# Patient Record
Sex: Female | Born: 1985 | Race: Black or African American | Hispanic: No | Marital: Single | Smoking: Former smoker
Health system: Southern US, Community
[De-identification: ages and names within clinical notes are randomized; demographics above are authoritative.]

## PROBLEM LIST (undated history)

## (undated) DIAGNOSIS — N926 Irregular menstruation, unspecified: Secondary | ICD-10-CM

## (undated) DIAGNOSIS — IMO0002 Reserved for concepts with insufficient information to code with codable children: Secondary | ICD-10-CM

## (undated) HISTORY — DX: Reserved for concepts with insufficient information to code with codable children: IMO0002

## (undated) HISTORY — DX: Irregular menstruation, unspecified: N92.6

---

## 2006-08-21 ENCOUNTER — Observation Stay: Payer: Self-pay

## 2006-09-06 ENCOUNTER — Inpatient Hospital Stay: Payer: Self-pay | Admitting: Obstetrics and Gynecology

## 2006-09-14 ENCOUNTER — Emergency Department: Payer: Self-pay | Admitting: Internal Medicine

## 2008-09-04 HISTORY — PX: CHOLECYSTECTOMY, LAPAROSCOPIC: SHX56

## 2008-12-21 ENCOUNTER — Emergency Department: Payer: Self-pay | Admitting: Emergency Medicine

## 2009-01-19 ENCOUNTER — Emergency Department: Payer: Self-pay | Admitting: Emergency Medicine

## 2010-05-16 IMAGING — US ABDOMEN ULTRASOUND
1 series · 17 of 25 positions shown · non-contrast
Comparison: none

REASON FOR EXAM: RUQ pain
COMMENTS:

PROCEDURE:     US  - US ABDOMEN GENERAL SURVEY  - January 19, 2009  [DATE]
RESULT:     Comparison: None
TECHNIQUE: Multiple gray-scale and color-flow Doppler images of the abdomen
are presented for review.

[Series 1: abdomen ultrasound · 17 of 46 slices shown]
[im 1/46]
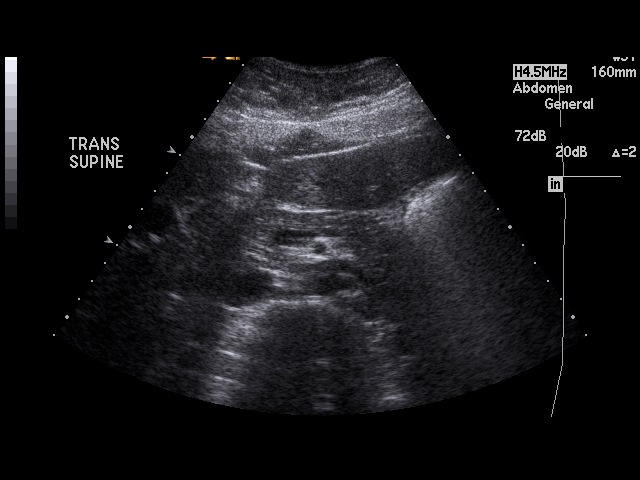
[im 4/46]
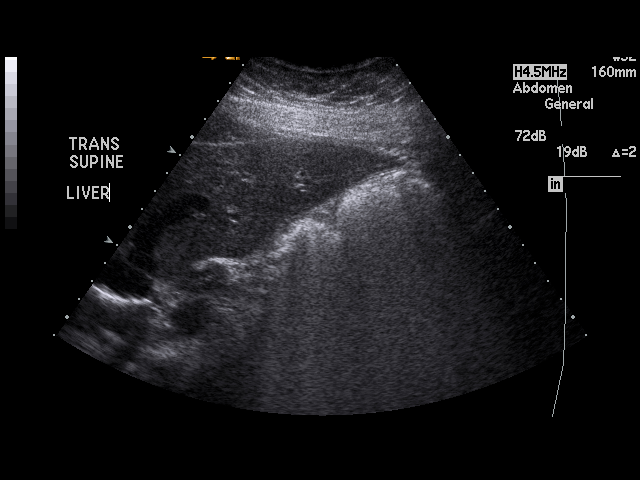
[im 6/46]
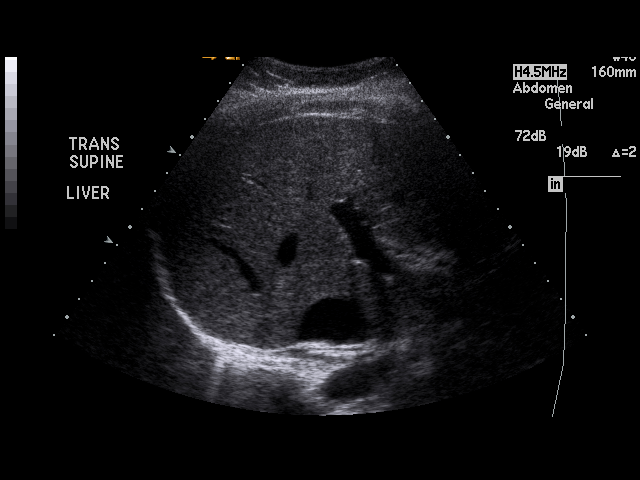
[im 10/46]
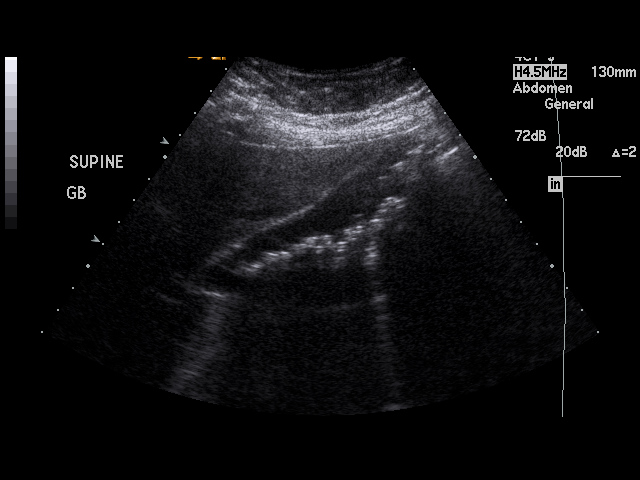
[im 12/46]
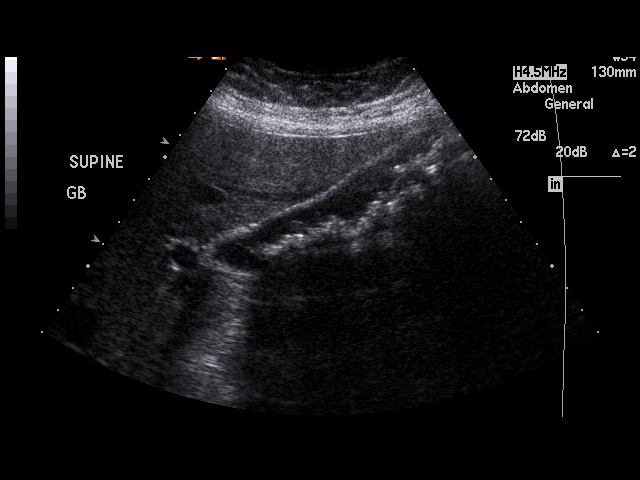
[im 16/46]
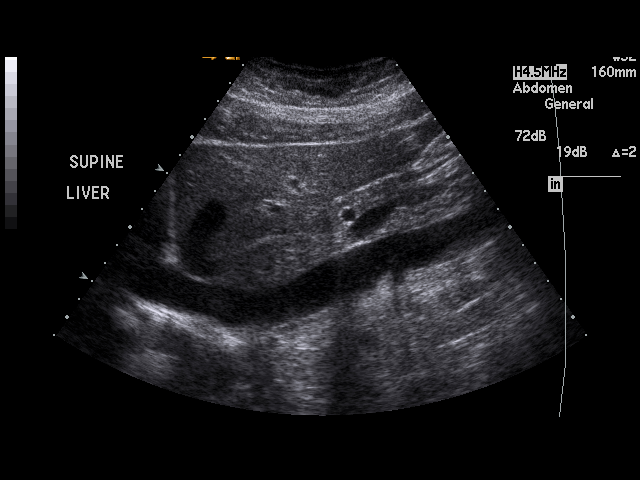
[im 17/46]
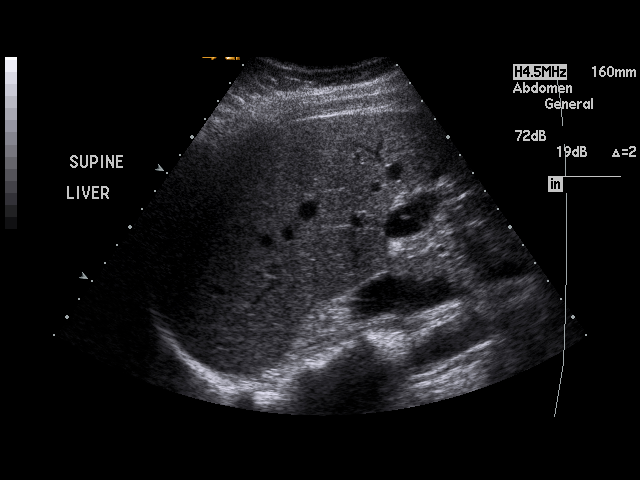
[im 21/46]
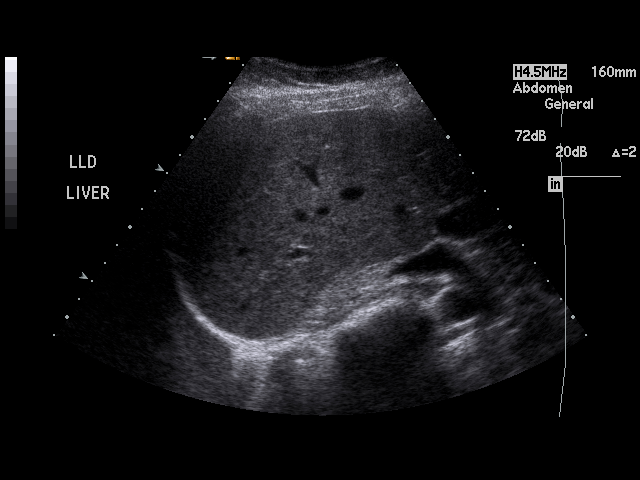
[im 23/46]
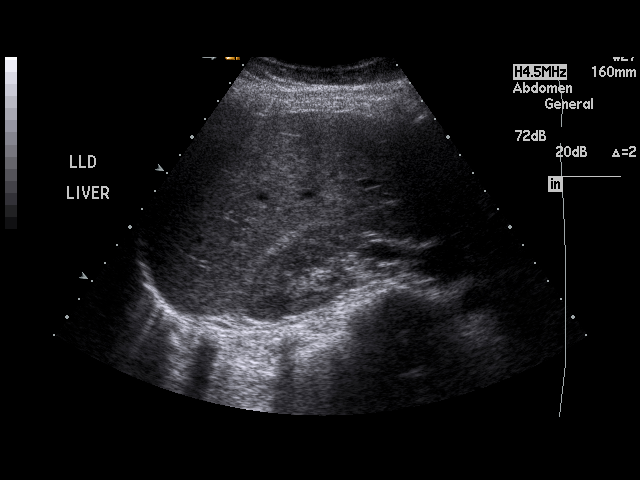
[im 25/46]
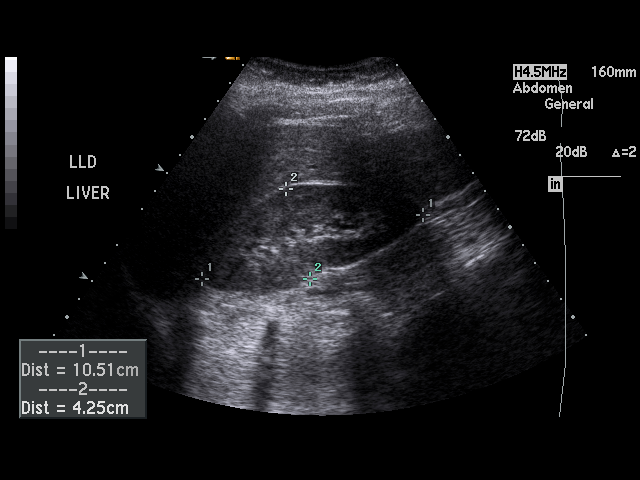
[im 29/46]
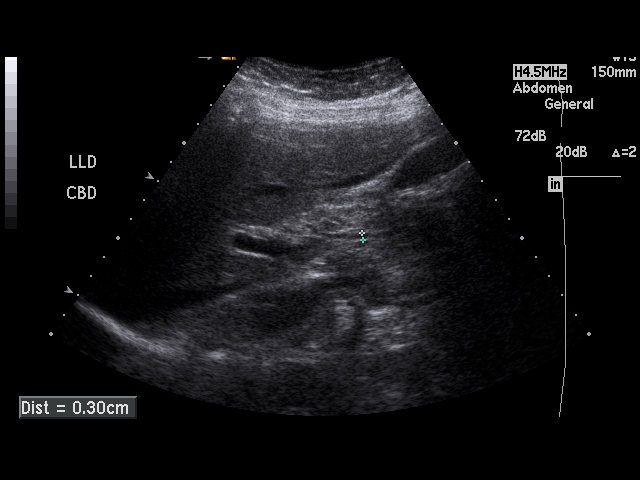
[im 31/46]
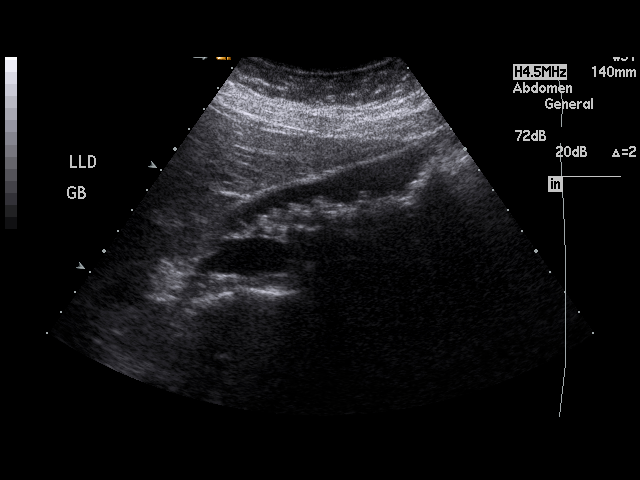
[im 34/46]
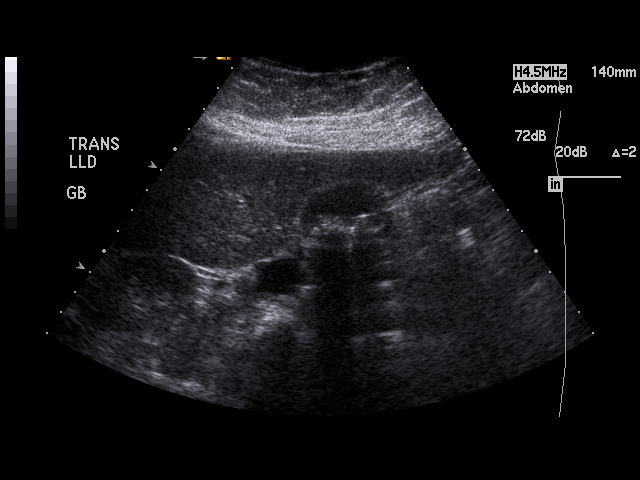
[im 36/46]
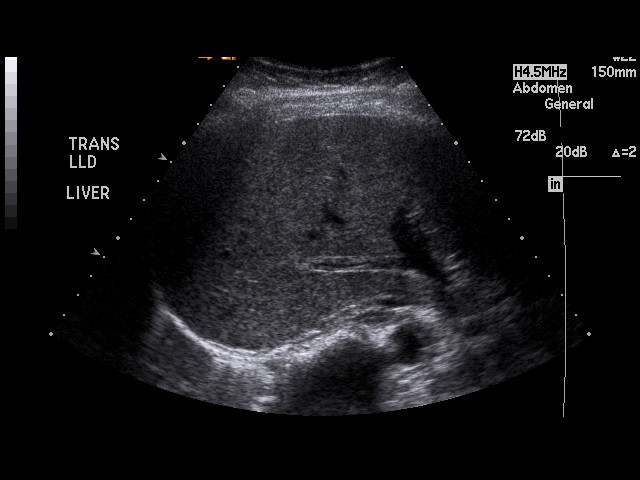
[im 40/46]
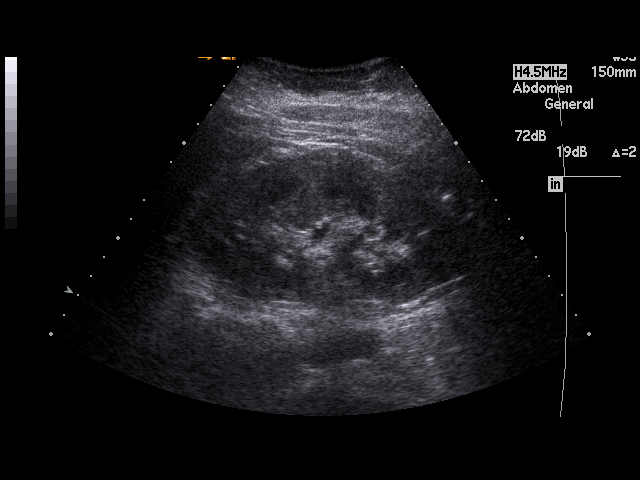
[im 42/46]
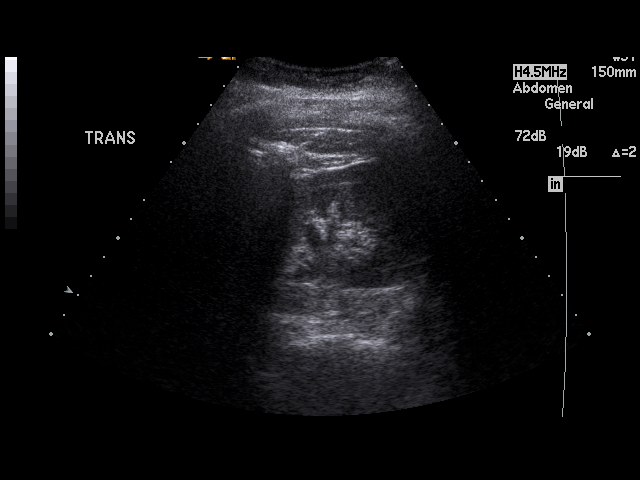
[im 46/46]
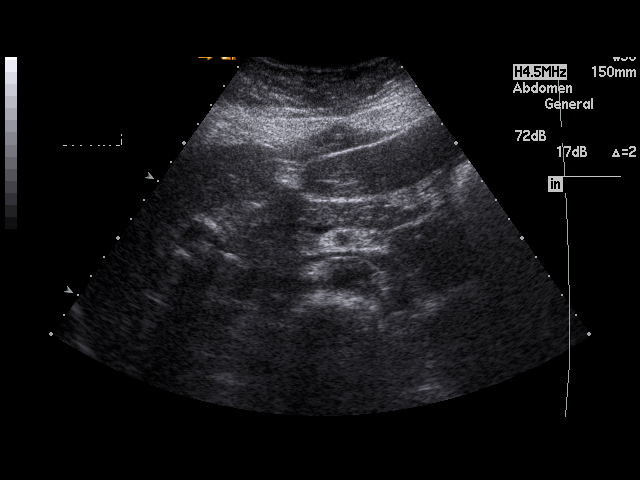

[17 of 25 positions shown; findings below may reference images not displayed]

FINDINGS: Visualized portions of the liver demonstrate normal echogenicity and normal
contours. The liver is without evidence of  focal hepatic lesion.

There are multiple mobile cholelithiasis. There is no intra or extrahepatic
biliary ductal dilatation. The common duct measures 2.5 mm in maximal
diameter. There is no gallbladder wall thickening, pericholecystic fluid, or
sonographic Murphy's sign.

The visualized portion of the pancreas is normal in echogenicity. The spleen
is unremarkable. Bilateral kidneys are normal in echogenicity and size. The
right kidney measures 10.5 cm. The left kidney measures 11.7 cm. There are
no renal calculi or hydronephrosis. The abdominal aorta and IVC are
unremarkable.
IMPRESSION: Cholelithiasis without sonographic evidence of acute cholecystitis.

## 2010-12-22 ENCOUNTER — Encounter: Payer: Self-pay | Admitting: Maternal and Fetal Medicine

## 2011-07-09 ENCOUNTER — Inpatient Hospital Stay: Payer: Self-pay

## 2013-03-21 ENCOUNTER — Emergency Department: Payer: Self-pay | Admitting: Emergency Medicine

## 2014-12-15 ENCOUNTER — Emergency Department
Admit: 2014-12-15 | Disposition: A | Discharge status: Home / Self Care | Payer: Self-pay | Admitting: Emergency Medicine

## 2014-12-15 LAB — BASIC METABOLIC PANEL
Anion Gap: 10 (ref 7–16)
BUN: 12 mg/dL
Calcium, Total: 9.1 mg/dL
Chloride: 101 mmol/L
Co2: 27 mmol/L
Creatinine: 0.91 mg/dL
EGFR (African American): >60
GLUCOSE: 89 mg/dL
POTASSIUM: 3.5 mmol/L
Sodium: 138 mmol/L

## 2014-12-15 LAB — CBC
HCT: 36.6 % (ref 35.0–47.0)
HGB: 11.7 g/dL — AB (ref 12.0–16.0)
MCH: 27.3 pg (ref 26.0–34.0)
MCHC: 32 g/dL (ref 32.0–36.0)
MCV: 85 fL (ref 80–100)
Platelet: 212 10*3/uL (ref 150–440)
RBC: 4.3 10*6/uL (ref 3.80–5.20)
RDW: 14.4 % (ref 11.5–14.5)
WBC: 5.3 10*3/uL (ref 3.6–11.0)

## 2014-12-15 LAB — TROPONIN I: Troponin-I: <0.03 ng/mL

## 2015-09-29 ENCOUNTER — Encounter: Payer: Self-pay | Admitting: Emergency Medicine

## 2015-09-29 ENCOUNTER — Emergency Department
Admission: EM | Admit: 2015-09-29 | Discharge: 2015-09-29 | Disposition: A | Discharge status: Home / Self Care | Payer: BLUE CROSS/BLUE SHIELD | Attending: Emergency Medicine | Admitting: Emergency Medicine

## 2015-09-29 ENCOUNTER — Emergency Department: Payer: BLUE CROSS/BLUE SHIELD

## 2015-09-29 DIAGNOSIS — O209 Hemorrhage in early pregnancy, unspecified: Secondary | ICD-10-CM | POA: Diagnosis present

## 2015-09-29 DIAGNOSIS — O23591 Infection of other part of genital tract in pregnancy, first trimester: Secondary | ICD-10-CM | POA: Diagnosis not present

## 2015-09-29 DIAGNOSIS — Z3A01 Less than 8 weeks gestation of pregnancy: Secondary | ICD-10-CM | POA: Insufficient documentation

## 2015-09-29 DIAGNOSIS — O99331 Smoking (tobacco) complicating pregnancy, first trimester: Secondary | ICD-10-CM | POA: Diagnosis not present

## 2015-09-29 DIAGNOSIS — B9689 Other specified bacterial agents as the cause of diseases classified elsewhere: Secondary | ICD-10-CM

## 2015-09-29 DIAGNOSIS — F17200 Nicotine dependence, unspecified, uncomplicated: Secondary | ICD-10-CM | POA: Diagnosis not present

## 2015-09-29 DIAGNOSIS — O26899 Other specified pregnancy related conditions, unspecified trimester: Secondary | ICD-10-CM

## 2015-09-29 DIAGNOSIS — N760 Acute vaginitis: Secondary | ICD-10-CM

## 2015-09-29 DIAGNOSIS — R109 Unspecified abdominal pain: Secondary | ICD-10-CM

## 2015-09-29 LAB — URINALYSIS COMPLETE WITH MICROSCOPIC (ARMC ONLY)
BILIRUBIN URINE: NEGATIVE
Bacteria, UA: NONE SEEN
Glucose, UA: NEGATIVE mg/dL
KETONES UR: NEGATIVE mg/dL
Leukocytes, UA: NEGATIVE
Nitrite: NEGATIVE
Protein, ur: NEGATIVE mg/dL
SPECIFIC GRAVITY, URINE: 1.025 (ref 1.005–1.030)
pH: 5 (ref 5.0–8.0)

## 2015-09-29 LAB — CBC WITH DIFFERENTIAL/PLATELET
Basophils Absolute: 0 10*3/uL (ref 0–0.1)
Basophils Relative: 1 %
EOS PCT: 4 %
Eosinophils Absolute: 0.2 10*3/uL (ref 0–0.7)
HEMATOCRIT: 36.5 % (ref 35.0–47.0)
Hemoglobin: 11.8 g/dL — ABNORMAL LOW (ref 12.0–16.0)
LYMPHS ABS: 2.8 10*3/uL (ref 1.0–3.6)
LYMPHS PCT: 50 %
MCH: 27.8 pg (ref 26.0–34.0)
MCHC: 32.3 g/dL (ref 32.0–36.0)
MCV: 86.1 fL (ref 80.0–100.0)
MONO ABS: 0.4 10*3/uL (ref 0.2–0.9)
Monocytes Relative: 7 %
Neutro Abs: 2.1 10*3/uL (ref 1.4–6.5)
Neutrophils Relative %: 38 %
Platelets: 216 10*3/uL (ref 150–440)
RBC: 4.24 MIL/uL (ref 3.80–5.20)
RDW: 14.2 % (ref 11.5–14.5)
WBC: 5.5 10*3/uL (ref 3.6–11.0)

## 2015-09-29 LAB — POCT PREGNANCY, URINE: PREG TEST UR: NEGATIVE

## 2015-09-29 LAB — WET PREP, GENITAL
SPERM: NONE SEEN
Trich, Wet Prep: NONE SEEN
Yeast Wet Prep HPF POC: NONE SEEN

## 2015-09-29 LAB — CHLAMYDIA/NGC RT PCR (ARMC ONLY)
Chlamydia Tr: NOT DETECTED
N gonorrhoeae: NOT DETECTED

## 2015-09-29 LAB — ABO/RH: ABO/RH(D): O POS

## 2015-09-29 LAB — HCG, QUANTITATIVE, PREGNANCY: HCG, BETA CHAIN, QUANT, S: 25 m[IU]/mL — AB (ref <5)

## 2015-09-29 MED ORDER — METRONIDAZOLE 500 MG PO TABS: 500.0000 mg | ORAL_TABLET | Freq: Two times a day (BID) | ORAL | Status: AC

## 2015-09-29 MED ORDER — METRONIDAZOLE 500 MG PO TABS
500.0000 mg | ORAL_TABLET | Freq: Once | ORAL | Status: AC
  Administered 2015-09-29: 500 mg via ORAL
  Filled 2015-09-29: qty 1

## 2015-09-29 NOTE — ED Notes (Signed)
Patient transported to Ultrasound 

## 2015-09-29 NOTE — ED Notes (Signed)
Patient states last normal period on 08/14/15. Took several home pregnancy test that were all positive. Has had intermittent bleeding with few clots for two weeks. Complains of mild lower abdominal cramping. No pain with urination.

## 2015-09-29 NOTE — ED Provider Notes (Signed)
[facility] Emergency Department Provider Note  ____________________________________________  Time seen: Approximately 10 AM  I have reviewed the triage vital signs and the nursing notes.   HISTORY  Chief Complaint Possible Pregnancy; Vaginal Bleeding; and Abdominal Cramping    HPI Brandi West is a 30 y.o. female who is a G5 P3 who is presenting today with several weeks of lower abdominal cramping as well as vaginal bleeding. She says the cramping has been intermittent and radiating through to her back in the lower abdomen. She denies any nausea, vomiting or diarrhea. Says that she has taken multiple pregnancy tests at home which have been positive. She states that her last period was in early December and that she has regular periods. She denies any history of sexually transmitted diseases. Has one miscarriage in the past. [place] Spaniel that as of several weeks ago she was having clots but has no vaginal bleeding or discharge as of this morning.   History reviewed. No pertinent past medical history.  There are no active problems to display for this patient.   History reviewed. No pertinent past surgical history.  No current outpatient prescriptions on file.  Allergies Review of patient's allergies indicates no known allergies.  History reviewed. No pertinent family history.  Social History Social History  Substance Use Topics  . Smoking status: Current Every Day Smoker  . Smokeless tobacco: None  . Alcohol Use: Yes    Review of Systems Constitutional: No fever/chills Eyes: No visual changes. ENT: No sore throat. Cardiovascular: Denies chest pain. Respiratory: Denies shortness of breath. Gastrointestinal:   No nausea, no vomiting.  No diarrhea.  No constipation. Genitourinary: Negative for dysuria. Musculoskeletal: Negative for back pain. Skin: Negative for rash. Neurological: Negative for headaches, focal weakness or numbness.  10-point ROS  otherwise negative.  ____________________________________________   PHYSICAL EXAM:  VITAL SIGNS: ED Triage Vitals  Enc Vitals Group     BP 09/29/15 0925 129/73 mmHg     Pulse Rate 09/29/15 0925 73     Resp 09/29/15 0925 18     Temp 09/29/15 0925 98.4 F (36.9 C)     Temp Source 09/29/15 0925 Oral     SpO2 09/29/15 0925 100 %     Weight 09/29/15 0925 173 lb (78.472 kg)     Height 09/29/15 0925  (1.626 m)     Head Cir --      Peak Flow --      Pain Score 09/29/15 0922 5     Pain Loc --      Pain Edu? --      Excl. in GC? --     Constitutional: Alert and oriented. Well appearing and in no acute distress. Eyes: Conjunctivae are normal. PERRL. EOMI. Head: Atraumatic. Nose: No congestion/rhinnorhea. Mouth/Throat: Mucous membranes are moist.  Oropharynx non-erythematous. Neck: No stridor.   Cardiovascular: Normal rate, regular rhythm. Grossly normal heart sounds.  Good peripheral circulation. Respiratory: Normal respiratory effort.  No retractions. Lungs CTAB. Gastrointestinal: Soft and nontender. No distention. No abdominal bruits. No CVA tenderness. Genitourinary:  Normal external exam. Speculum exam with a small amount of yellow discharge. Bimanual exam with a closed cervical os. There is no cervical motion tenderness. There is no uterine or adnexal tenderness or masses. Musculoskeletal: No lower extremity tenderness nor edema.  No joint effusions. Neurologic:  Normal speech and language. No gross focal neurologic deficits are appreciated. No gait instability. Skin:  Skin is warm, dry and intact. No rash noted. Psychiatric:  Mood and affect are normal. Speech and behavior are normal.  ____________________________________________   LABS (all labs ordered are listed, but only abnormal results are displayed)  Labs Reviewed  WET PREP, GENITAL - Abnormal; Notable for the following:    Clue Cells Wet Prep HPF POC PRESENT (*)    WBC, Wet Prep HPF POC MODERATE (*)    All  other components within normal limits  HCG, QUANTITATIVE, PREGNANCY - Abnormal; Notable for the following:    hCG, Beta Chain, Quant, S 25 (*)    All other components within normal limits  URINALYSIS COMPLETEWITH MICROSCOPIC ([facility] ONLY) - Abnormal; Notable for the following:    Color, Urine YELLOW (*)    APPearance CLEAR (*)    Hgb urine dipstick 1+ (*)    Squamous Epithelial / LPF 0-5 (*)    All other components within normal limits  CBC WITH DIFFERENTIAL/PLATELET - Abnormal; Notable for the following:    Hemoglobin 11.8 (*)    All other components within normal limits  CHLAMYDIA/NGC RT PCR ([facility] ONLY)  POCT PREGNANCY, URINE  ABO/RH   ____________________________________________  EKG   ____________________________________________  RADIOLOGY  No intrauterine pregnancy, ultrasound. ____________________________________________   PROCEDURES   ____________________________________________   INITIAL IMPRESSION / ASSESSMENT AND PLAN / ED COURSE  Pertinent labs & imaging results that were available during my care of the patient were reviewed by me and considered in my medical decision making (see chart for details).  ----------------------------------------- 12:46 PM on 09/29/2015 -----------------------------------------  Patient is resting comfortably at this time. Explained to her her low hormone levels as well as her RESULTS. SHE UNDERSTANDS THAT SHE MAY STILL HAVE AN ECTOPIC PREGNANCY BUT CONSIDERING HER HISTORY IS MUCH MORE LIKELY THAT SHE HAS A MISCARRIAGE. WE ALSO DISCUSSED HER BACTERIAL VAGINOSIS AND SHE'LL BE RECEIVING FLAGYL. SHE KNOWS NOT TO DRINK WITH THIS MEDICATION. SHE ALSO KNOWS TO FOLLOW-UP WITH [facility] WHERE SHE HAS BEEN SEEN IN THE PAST FOR A HORMONE CHECK IN 48 HOURS. WE ALSO DISCUSSED RETURN PRECAUTIONS INCLUDING INCREASED ABDOMINAL PAIN RETURNED OR BLEEDING. SHE UNDERSTANDS THE PLAN AND IS WILLING TO  COMPLY. ____________________________________________   FINAL CLINICAL IMPRESSION(S) / ED DIAGNOSES  Abdominal pain in pregnancy.    Myrna Blazer, MD 09/29/15 1247

## 2015-09-29 NOTE — ED Notes (Signed)
Pt reports recently found out she was pregnant via an at home test. Pt reports some vaginal bleeding intermittently for the past 2 weeks and now abdominal cramping.

## 2015-09-29 NOTE — Discharge Instructions (Signed)

## 2016-07-29 IMAGING — US US OB COMP LESS 14 WK
1 series · 14 of 28 positions shown · non-contrast
Comparison: None.

CLINICAL DATA: Possibly pregnant, vaginal bleeding, cramping. Beta
HCG 25

EXAM:
OBSTETRIC <14 WK US AND TRANSVAGINAL OB US
TECHNIQUE: Both transabdominal and transvaginal ultrasound examinations were
performed for complete evaluation of the gestation as well as the
maternal uterus, adnexal regions, and pelvic cul-de-sac.
Transvaginal technique was performed to assess early pregnancy.

[Series 1: us ob comp less 14 wk · 0.23mm/px · 14 of 138 slices shown]
[im 6/138]
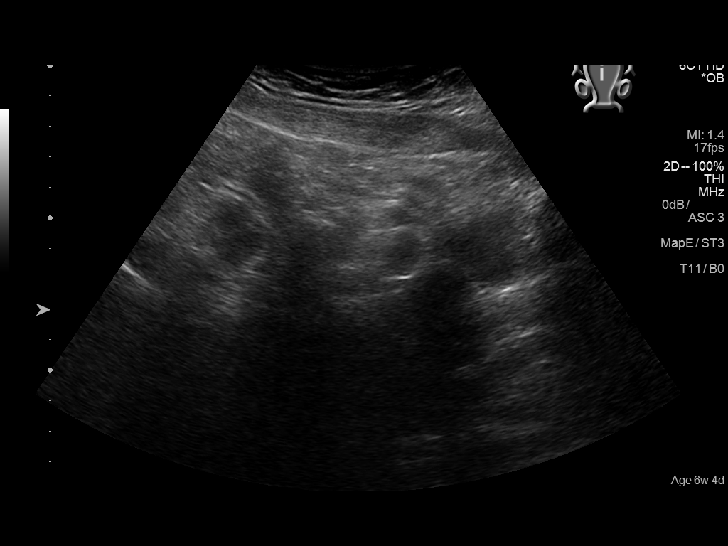
[im 16/138]
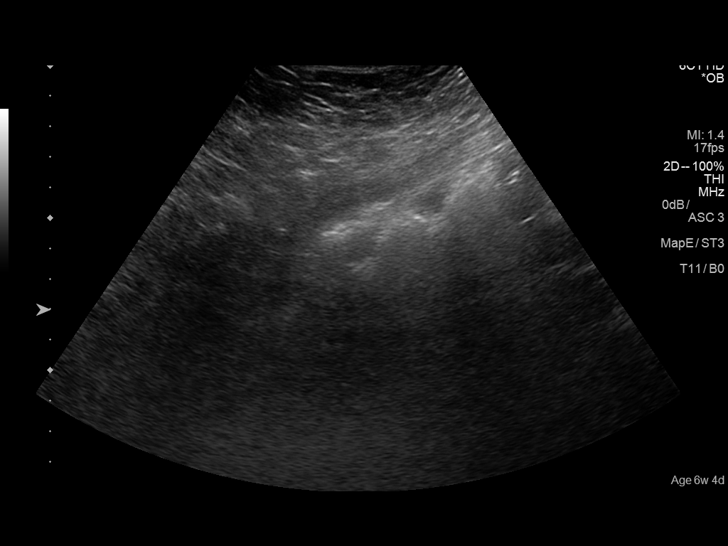
[im 26/138]
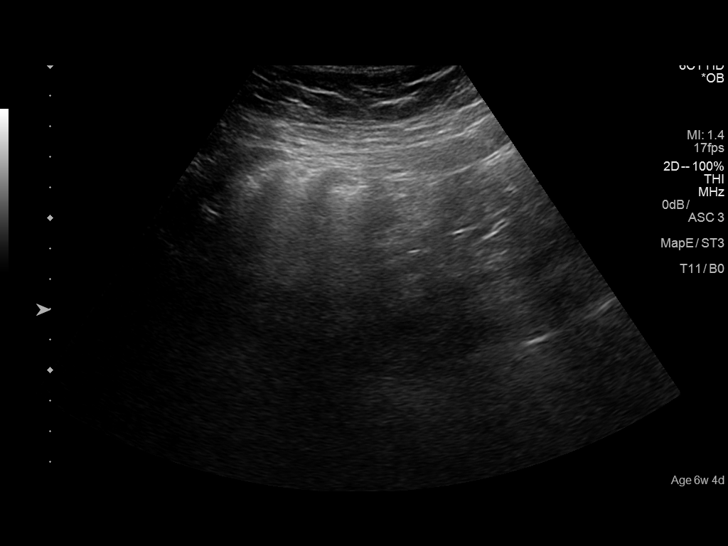
[im 36/138]
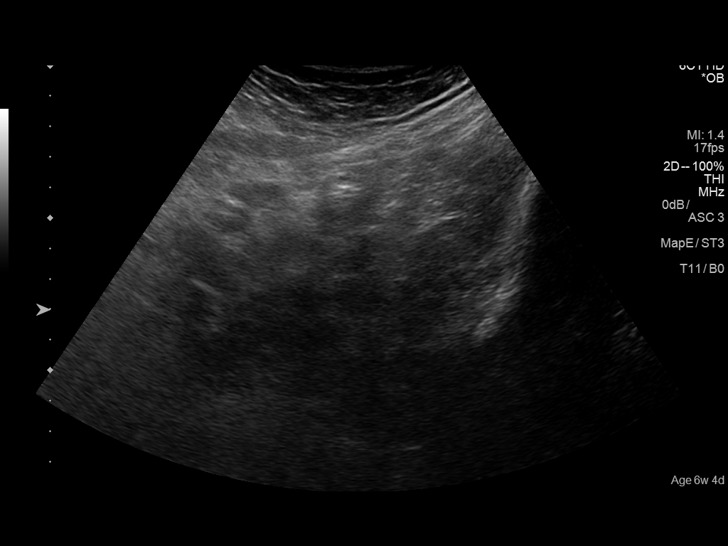
[im 46/138]
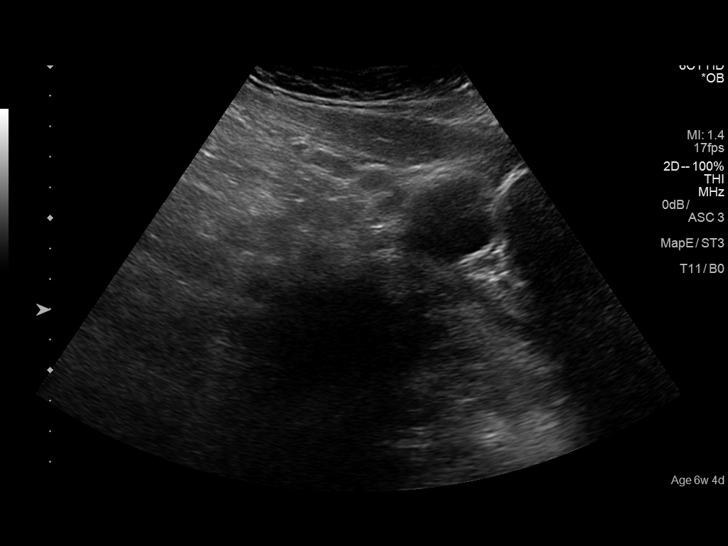
[im 56/138]
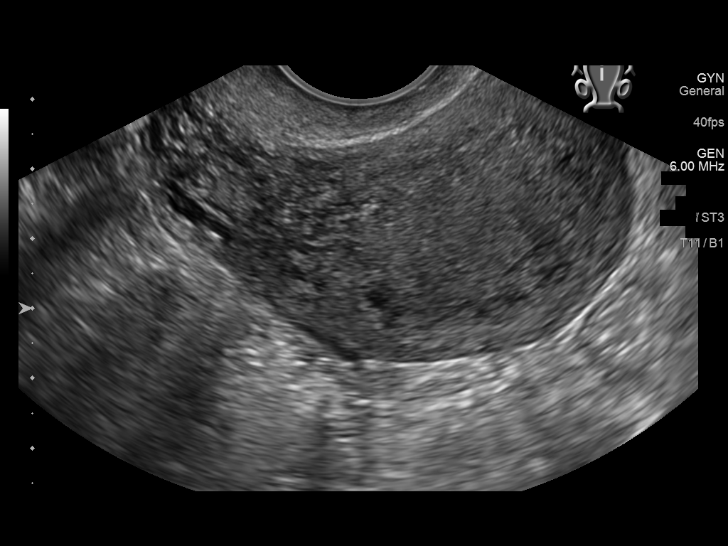
[im 66/138]
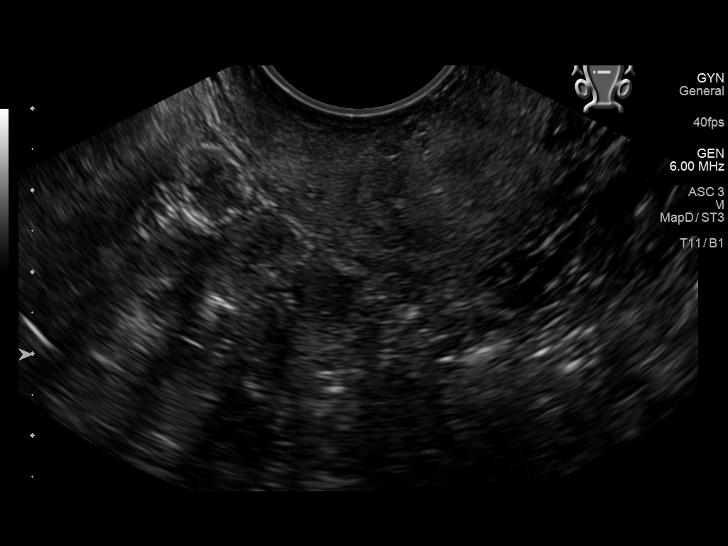
[im 77/138]
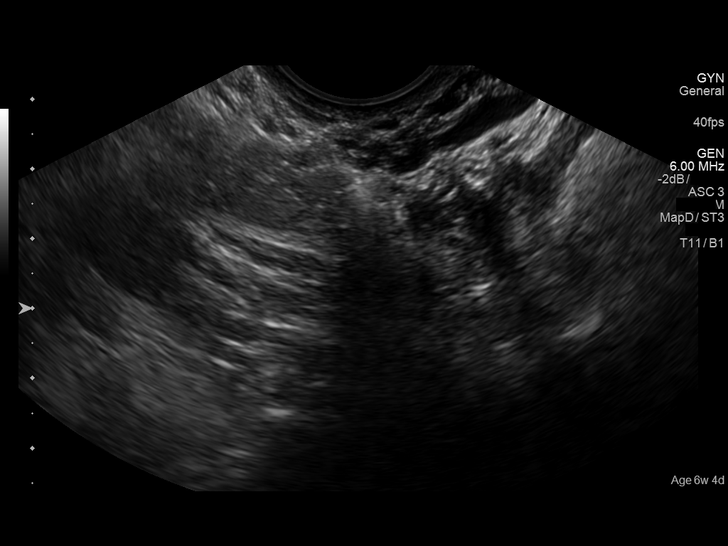
[im 87/138]
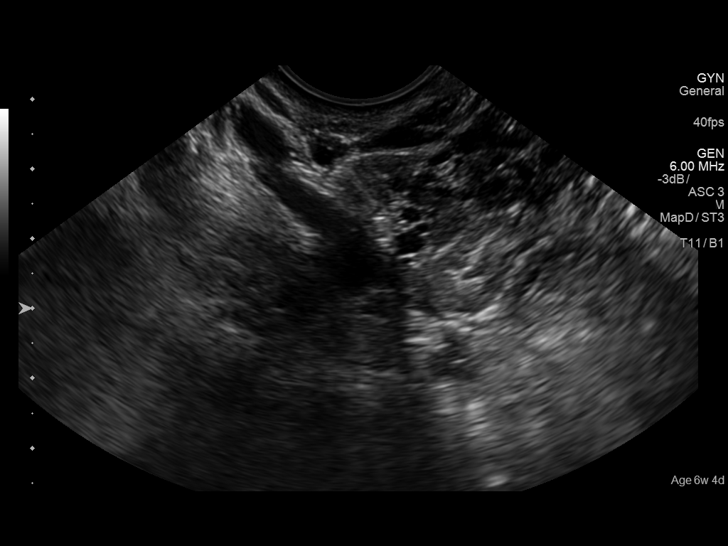
[im 97/138]
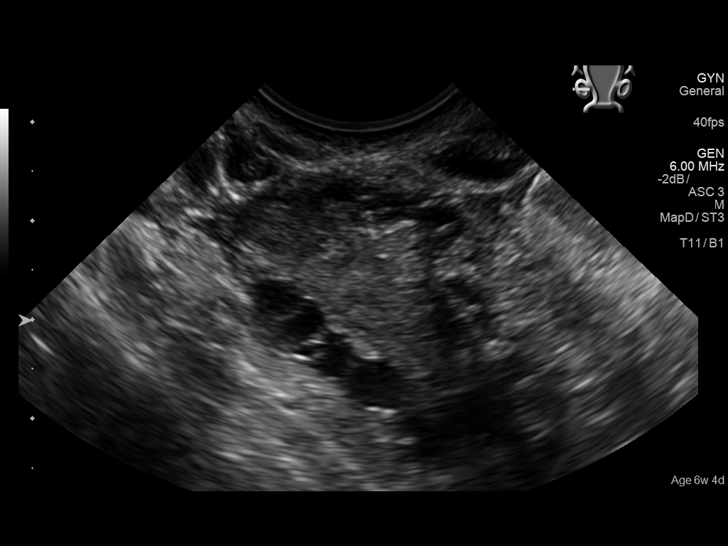
[im 107/138]
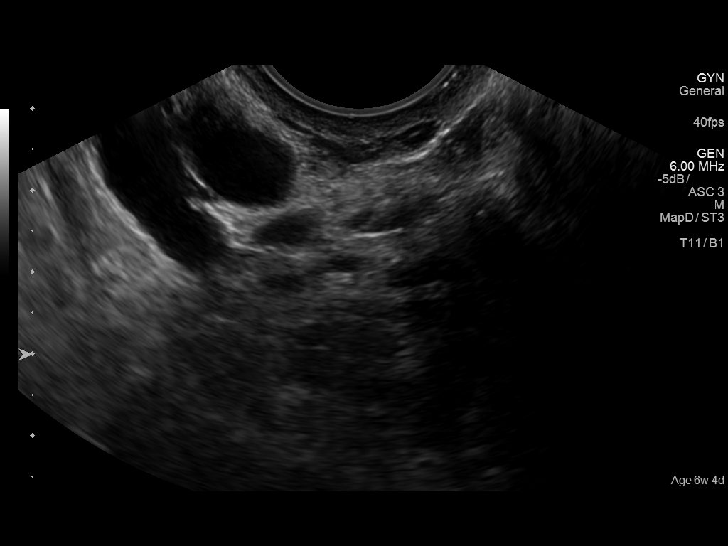
[im 117/138]
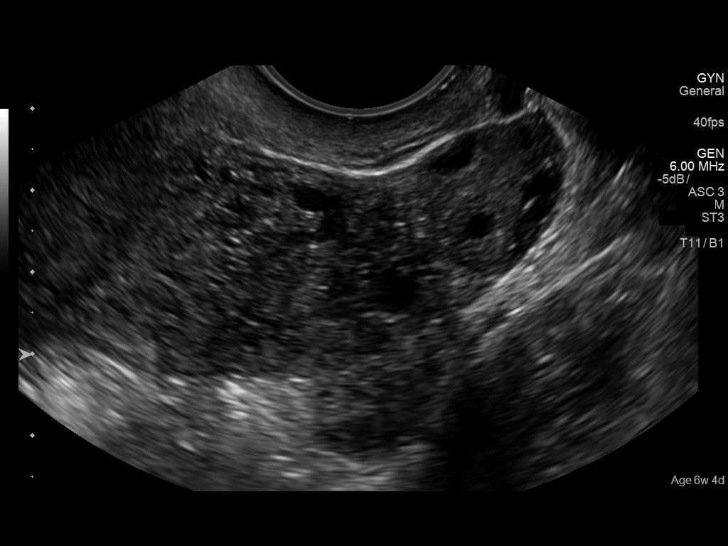
[im 127/138]
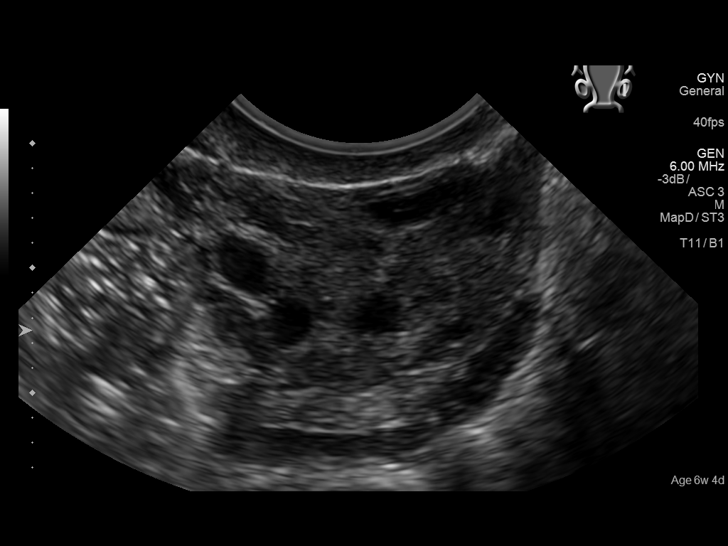
[im 138/138]
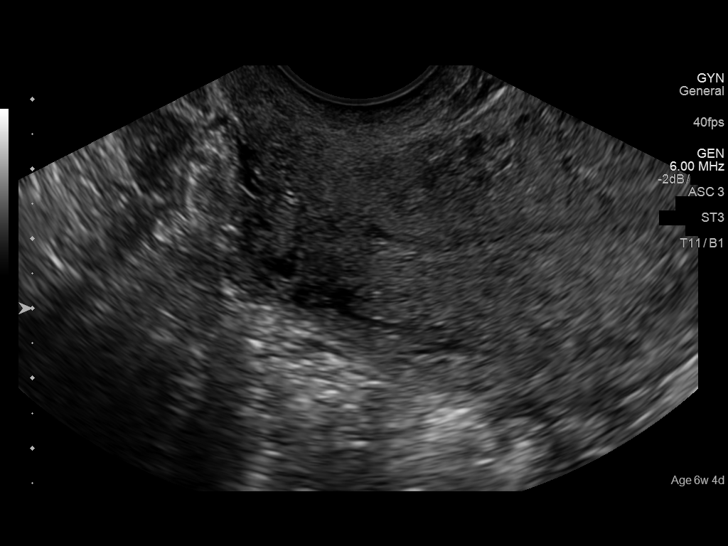

[14 of 28 positions shown; findings below may reference images not displayed]

FINDINGS: Intrauterine gestational sac: Not present

Yolk sac:  Not visualized

Embryo:  Not visualized

Cardiac Activity: Not visualized

Maternal uterus/adnexae: No pelvic free fluid. No adnexal mass.
Normal right ovary measuring 2.1 x 2.9 x 1.3 cm normal left ovary
measuring 2.1 x 2.9 x 1.3 cm.
IMPRESSION: No intrauterine pregnancy. Given the elevated beta HCG, differential
diagnosis includes missed abortion versus pregnancy too early to
detect versus ectopic pregnancy. Recommend clinical correlation,
serial quantitative beta HCGs, ectopic precautions, and followup
ultrasound as clinically indicated.

## 2017-02-16 ENCOUNTER — Other Ambulatory Visit: Payer: BLUE CROSS/BLUE SHIELD

## 2017-02-16 ENCOUNTER — Ambulatory Visit (INDEPENDENT_AMBULATORY_CARE_PROVIDER_SITE_OTHER): Payer: BLUE CROSS/BLUE SHIELD | Admitting: Obstetrics and Gynecology

## 2017-02-16 VITALS — BP 118/81 | HR 66 | Ht 64.0 in | Wt 193.7 lb

## 2017-02-16 DIAGNOSIS — Z1389 Encounter for screening for other disorder: Secondary | ICD-10-CM

## 2017-02-16 DIAGNOSIS — Z3201 Encounter for pregnancy test, result positive: Secondary | ICD-10-CM

## 2017-02-16 DIAGNOSIS — Z3481 Encounter for supervision of other normal pregnancy, first trimester: Secondary | ICD-10-CM

## 2017-02-16 DIAGNOSIS — E669 Obesity, unspecified: Secondary | ICD-10-CM

## 2017-02-16 DIAGNOSIS — Z113 Encounter for screening for infections with a predominantly sexual mode of transmission: Secondary | ICD-10-CM

## 2017-02-16 DIAGNOSIS — N926 Irregular menstruation, unspecified: Secondary | ICD-10-CM | POA: Insufficient documentation

## 2017-02-16 NOTE — Progress Notes (Signed)
Brandi West presents for NOB nurse interview visit. Pregnancy confirmation done at [facility] on 01/26/2017. UPT: positive.  [id number].  [id number]. Pt had child that died at 30 yrs old with multiple health problems. Pt states she came in contact with someone while she as pregnant that caused her medical problems (CMV, Scloliosis, CP, Asthma).  Pregnancy education material explained and given. No cats in the home. NOB labs ordered. TSH/HbgA1c due to Increased BMI-31. HIV labs and Drug screen were explained optional and she did not decline. Drug screen ordered. PNV encouraged. Genetic screening options discussed. Genetic testing: Unsure. Does not think she is interested. Pt may discuss with provider. Ultrasound ordered for dating and viability due to hx irregular menses. Pt. To follow up with provider in 3 weeks for NOB physical.  All questions answered.

## 2017-02-16 NOTE — Patient Instructions (Signed)
Pregnancy and Zika Virus Disease Zika virus disease, or Zika, is an illness that can spread to people from mosquitoes that carry the virus. It may also spread from person to person through infected body fluids. Zika first occurred in [place], but recently it has spread to new areas. The virus occurs in tropical climates. The location of Zika continues to change. Most people who become infected with Zika virus do not develop serious illness. However, Zika may cause birth defects in an unborn baby whose mother is infected with the virus. It may also increase the risk of miscarriage. What are the symptoms of Zika virus disease? In many cases, people who have been infected with Zika virus do not develop any symptoms. If symptoms appear, they usually start about a week after the person is infected. Symptoms are usually mild. They may include:  Fever.  Rash.  Red eyes.  Joint pain.  How does Zika virus disease spread? The main way that Zika virus spreads is through the bite of a certain type of mosquito. Unlike most types of mosquitos, which bite only at night, the type of mosquito that carries Zika virus bites both at night and during the day. Zika virus can also spread through sexual contact, through a blood transfusion, and from a mother to her baby before or during birth. Once you have had Zika virus disease, it is unlikely that you will get it again. Can I pass Zika to my baby during pregnancy? Yes, Zika can pass from a mother to her baby before or during birth. What problems can Zika cause for my baby? A woman who is infected with Zika virus while pregnant is at risk of having her baby born with a condition in which the brain or head is smaller than expected (microcephaly). Babies who have microcephaly can have developmental delays, seizures, hearing problems, and vision problems. Having Zika virus disease during pregnancy can also increase the risk of miscarriage. How can Zika virus disease be  prevented? There is no vaccine to prevent Zika. The best way to prevent the disease is to avoid infected mosquitoes and avoid exposure to body fluids that can spread the virus. Avoid any possible exposure to Zika by taking the following precautions. For women and their sex partners:  Avoid traveling to high-risk areas. The locations where Zika is being reported change often. To identify high-risk areas, check the CDC travel website: www.cdc.gov/zika/geo/index.html  If you or your sex partner must travel to a high-risk area, talk with a health care provider before and after traveling.  Take all precautions to avoid mosquito bites if you live in, or travel to, any of the high-risk areas. Insect repellents are safe to use during pregnancy.  Ask your health care provider when it is safe to have sexual contact.  For women:  If you are pregnant or trying to become pregnant, avoid sexual contact with persons who may have been exposed to Zika virus, persons who have possible symptoms of Zika, or persons whose history you are unsure about. If you choose to have sexual contact with someone who may have been exposed to Zika virus, use condoms correctly during the entire duration of sexual activity, every time. Do not share sexual devices, as you may be exposed to body fluids.  Ask your health care provider about when it is safe to attempt pregnancy after a possible exposure to Zika virus.  What steps should I take to avoid mosquito bites? Take these steps to avoid mosquito bites   when you are in a high-risk area:  Wear loose clothing that covers your arms and legs.  Limit your outdoor activities.  Do not open windows unless they have window screens.  Sleep under mosquito nets.  Use insect repellent. The best insect repellents have:  DEET, picaridin, oil of lemon eucalyptus (OLE), or IR3535 in them.  Higher amounts of an active ingredient in them.  Remember that insect repellents are safe to  use during pregnancy.  Do not use OLE on children who are younger than 3 years of age. Do not use insect repellent on babies who are younger than 2 months of age.  Cover your child's stroller with mosquito netting. Make sure the netting fits snugly and that any loose netting does not cover your child's mouth or nose. Do not use a blanket as a mosquito-protection cover.  Do not apply insect repellent underneath clothing.  If you are using sunscreen, apply the sunscreen before applying the insect repellent.  Treat clothing with permethrin. Do not apply permethrin directly to your skin. Follow label directions for safe use.  Get rid of standing water, where mosquitoes may reproduce. Standing water is often found in items such as buckets, bowls, animal food dishes, and flowerpots.  When you return from traveling to any high-risk area, continue taking actions to protect yourself against mosquito bites for 3 weeks, even if you show no signs of illness. This will prevent spreading Zika virus to uninfected mosquitoes. What should I know about the sexual transmission of Zika? People can spread Zika to their sexual partners during vaginal, anal, or oral sex, or by sharing sexual devices. Many people with Zika do not develop symptoms, so a person could spread the disease without knowing that they are infected. The greatest risk is to women who are pregnant or who may become pregnant. Zika virus can live longer in semen than it can live in blood. Couples can prevent sexual transmission of the virus by:  Using condoms correctly during the entire duration of sexual activity, every time. This includes vaginal, anal, and oral sex.  Not sharing sexual devices. Sharing increases your risk of being exposed to body fluid from another person.  Avoiding all sexual activity until your health care provider says it is safe.  Should I be tested for Zika virus? A sample of your blood can be tested for Zika virus. A  pregnant woman should be tested if she may have been exposed to the virus or if she has symptoms of Zika. She may also have additional tests done during her pregnancy, such ultrasound testing. Talk with your health care provider about which tests are recommended. This information is not intended to replace advice given to you by your health care provider. Make sure you discuss any questions you have with your health care provider. Document Released: 05/12/2015 Document Revised: 01/27/2016 Document Reviewed: 05/05/2015 Elsevier Interactive Patient Education  2018 [employer] Hyperemesis Gravidarum Hyperemesis gravidarum is a severe form of nausea and vomiting that happens during pregnancy. Hyperemesis is worse than morning sickness. It may cause you to have nausea or vomiting all day for many days. It may keep you from eating and drinking enough food and liquids. Hyperemesis usually occurs during the first half (the first 20 weeks) of pregnancy. It often goes away once a woman is in her second half of pregnancy. However, sometimes hyperemesis continues through an entire pregnancy. What are the causes? The cause of this condition is not known. It may be related   to changes in chemicals (hormones) in the body during pregnancy, such as the high level of pregnancy hormone (human chorionic gonadotropin) or the increase in the female sex hormone (estrogen). What are the signs or symptoms? Symptoms of this condition include:  Severe nausea and vomiting.  Nausea that does not go away.  Vomiting that does not allow you to keep any food down.  Weight loss.  Body fluid loss (dehydration).  Having no desire to eat, or not liking food that you have previously enjoyed.  How is this diagnosed? This condition may be diagnosed based on:  A physical exam.  Your medical history.  Your symptoms.  Blood tests.  Urine tests.  How is this treated? This condition may be managed with medicine. If  medicines to do not help relieve nausea and vomiting, you may need to receive fluids through an IV tube at the hospital. Follow these instructions at home:  Take over-the-counter and prescription medicines only as told by your health care provider.  Avoid iron pills and multivitamins that contain iron for the first 3-4 months of pregnancy. If you take prescription iron pills, do not stop taking them unless your health care provider approves.  Take the following actions to help prevent nausea and vomiting: ? In the morning, before getting out of bed, try eating a couple of dry crackers or a piece of toast. ? Avoid foods and smells that upset your stomach. Fatty and spicy foods may make nausea worse. ? Eat 5-6 small meals a day. ? Do not drink fluids while eating meals. Drink between meals. ? Eat or suck on things that have ginger in them. Ginger can help relieve nausea. ? Avoid food preparation. The smell of food can spoil your appetite or trigger nausea.  Follow instructions from your health care provider about eating or drinking restrictions.  For snacks, eat high-protein foods, such as cheese.  Keep all follow-up and pre-birth (prenatal) visits as told by your health care provider. This is important. Contact a health care provider if:  You have pain in your abdomen.  You have a severe headache.  You have vision problems.  You are losing weight. Get help right away if:  You cannot drink fluids without vomiting.  You vomit blood.  You have constant nausea and vomiting.  You are very weak.  You are very thirsty.  You feel dizzy.  You faint.  You have a fever or other symptoms that last for more than 2-3 days.  You have a fever and your symptoms suddenly get worse. Summary  Hyperemesis gravidarum is a severe form of nausea and vomiting that happens during pregnancy.  Making some changes to your eating habits may help relieve nausea and vomiting.  This condition may  be managed with medicine.  If medicines to do not help relieve nausea and vomiting, you may need to receive fluids through an IV tube at the hospital. This information is not intended to replace advice given to you by your health care provider. Make sure you discuss any questions you have with your health care provider. Document Released: 08/21/2005 Document Revised: 04/19/2016 Document Reviewed: 04/19/2016 Elsevier Interactive Patient Education  2017 [employer] First Trimester of Pregnancy The first trimester of pregnancy is from week 1 until the end of week 13 (months 1 through 3). During this time, your baby will begin to develop inside you. At 6-8 weeks, the eyes and face are formed, and the heartbeat can be seen on ultrasound. At the   end of 12 weeks, all the baby's organs are formed. Prenatal care is all the medical care you receive before the birth of your baby. Make sure you get good prenatal care and follow all of your doctor's instructions. Follow these instructions at home: Medicines  Take over-the-counter and prescription medicines only as told by your doctor. Some medicines are safe and some medicines are not safe during pregnancy.  Take a prenatal vitamin that contains at least 600 micrograms (mcg) of folic acid.  If you have trouble pooping (constipation), take medicine that will make your stool soft (stool softener) if your doctor approves. Eating and drinking  Eat regular, healthy meals.  Your doctor will tell you the amount of weight gain that is right for you.  Avoid raw meat and uncooked cheese.  If you feel sick to your stomach (nauseous) or throw up (vomit): ? Eat 4 or 5 small meals a day instead of 3 large meals. ? Try eating a few soda crackers. ? Drink liquids between meals instead of during meals.  To prevent constipation: ? Eat foods that are high in fiber, like fresh fruits and vegetables, whole grains, and beans. ? Drink enough fluids to keep your pee  (urine) clear or pale yellow. Activity  Exercise only as told by your doctor. Stop exercising if you have cramps or pain in your lower belly (abdomen) or low back.  Do not exercise if it is too hot, too humid, or if you are in a place of great height (high altitude).  Try to avoid standing for long periods of time. Move your legs often if you must stand in one place for a long time.  Avoid heavy lifting.  Wear low-heeled shoes. Sit and stand up straight.  You can have sex unless your doctor tells you not to. Relieving pain and discomfort  Wear a good support bra if your breasts are sore.  Take warm water baths (sitz baths) to soothe pain or discomfort caused by hemorrhoids. Use hemorrhoid cream if your doctor says it is okay.  Rest with your legs raised if you have leg cramps or low back pain.  If you have puffy, bulging veins (varicose veins) in your legs: ? Wear support hose or compression stockings as told by your doctor. ? Raise (elevate) your feet for 15 minutes, 3-4 times a day. ? Limit salt in your food. Prenatal care  Schedule your prenatal visits by the twelfth week of pregnancy.  Write down your questions. Take them to your prenatal visits.  Keep all your prenatal visits as told by your doctor. This is important. Safety  Wear your seat belt at all times when driving.  Make a list of emergency phone numbers. The list should include numbers for family, friends, the hospital, and police and fire departments. General instructions  Ask your doctor for a referral to a local prenatal class. Begin classes no later than at the start of month 6 of your pregnancy.  Ask for help if you need counseling or if you need help with nutrition. Your doctor can give you advice or tell you where to go for help.  Do not use hot tubs, steam rooms, or saunas.  Do not douche or use tampons or scented sanitary pads.  Do not cross your legs for long periods of time.  Avoid all herbs  and alcohol. Avoid drugs that are not approved by your doctor.  Do not use any tobacco products, including cigarettes, chewing tobacco, and electronic cigarettes.   If you need help quitting, ask your doctor. You may get counseling or other support to help you quit.  Avoid cat litter boxes and soil used by cats. These carry germs that can cause birth defects in the baby and can cause a loss of your baby (miscarriage) or stillbirth.  Visit your dentist. At home, brush your teeth with a soft toothbrush. Be gentle when you floss. Contact a doctor if:  You are dizzy.  You have mild cramps or pressure in your lower belly.  You have a nagging pain in your belly area.  You continue to feel sick to your stomach, you throw up, or you have watery poop (diarrhea).  You have a bad smelling fluid coming from your vagina.  You have pain when you pee (urinate).  You have increased puffiness (swelling) in your face, hands, legs, or ankles. Get help right away if:  You have a fever.  You are leaking fluid from your vagina.  You have spotting or bleeding from your vagina.  You have very bad belly cramping or pain.  You gain or lose weight rapidly.  You throw up blood. It may look like coffee grounds.  You are around people who have German measles, fifth disease, or chickenpox.  You have a very bad headache.  You have shortness of breath.  You have any kind of trauma, such as from a fall or a car accident. Summary  The first trimester of pregnancy is from week 1 until the end of week 13 (months 1 through 3).  To take care of yourself and your unborn baby, you will need to eat healthy meals, take medicines only if your doctor tells you to do so, and do activities that are safe for you and your baby.  Keep all follow-up visits as told by your doctor. This is important as your doctor will have to ensure that your baby is healthy and growing well. This information is not intended to replace  advice given to you by your health care provider. Make sure you discuss any questions you have with your health care provider. Document Released: 02/07/2008 Document Revised: 08/29/2016 Document Reviewed: 08/29/2016 Elsevier Interactive Patient Education  2017 [employer] Commonly Asked Questions During Pregnancy  Cats: A parasite can be excreted in cat feces.  To avoid exposure you need to have another person empty the little box.  If you must empty the litter box you will need to wear gloves.  Wash your hands after handling your cat.  This parasite can also be found in raw or undercooked meat so this should also be avoided.  Colds, Sore Throats, Flu: Please check your medication sheet to see what you can take for symptoms.  If your symptoms are unrelieved by these medications please call the office.  Dental Work: Most any dental work your dentist recommends is permitted.  X-rays should only be taken during the first trimester if absolutely necessary.  Your abdomen should be shielded with a lead apron during all x-rays.  Please notify your provider prior to receiving any x-rays.  Novocaine is fine; gas is not recommended.  If your dentist requires a note from us prior to dental work please call the office and we will provide one for you.  Exercise: Exercise is an important part of staying healthy during your pregnancy.  You may continue most exercises you were accustomed to prior to pregnancy.  Later in your pregnancy you will most likely notice you have difficulty with activities   requiring balance like riding a bicycle.  It is important that you listen to your body and avoid activities that put you at a higher risk of falling.  Adequate rest and staying well hydrated are a must!  If you have questions about the safety of specific activities ask your provider.    Exposure to Children with illness: Try to avoid obvious exposure; report any symptoms to us when noted,  If you have chicken pos, red  measles or mumps, you should be immune to these diseases.   Please do not take any vaccines while pregnant unless you have checked with your OB provider.  Fetal Movement: After 28 weeks we recommend you do "kick counts" twice daily.  Lie or sit down in a calm quiet environment and count your baby movements "kicks".  You should feel your baby at least 10 times per hour.  If you have not felt 10 kicks within the first hour get up, walk around and have something sweet to eat or drink then repeat for an additional hour.  If count remains less than 10 per hour notify your provider.  Fumigating: Follow your pest control agent's advice as to how long to stay out of your home.  Ventilate the area well before re-entering.  Hemorrhoids:   Most over-the-counter preparations can be used during pregnancy.  Check your medication to see what is safe to use.  It is important to use a stool softener or fiber in your diet and to drink lots of liquids.  If hemorrhoids seem to be getting worse please call the office.   Hot Tubs:  Hot tubs Jacuzzis and saunas are not recommended while pregnant.  These increase your internal body temperature and should be avoided.  Intercourse:  Sexual intercourse is safe during pregnancy as long as you are comfortable, unless otherwise advised by your provider.  Spotting may occur after intercourse; report any bright red bleeding that is heavier than spotting.  Labor:  If you know that you are in labor, please go to the hospital.  If you are unsure, please call the office and let us help you decide what to do.  Lifting, straining, etc:  If your job requires heavy lifting or straining please check with your provider for any limitations.  Generally, you should not lift items heavier than that you can lift simply with your hands and arms (no back muscles)  Painting:  Paint fumes do not harm your pregnancy, but may make you ill and should be avoided if possible.  Latex or water based paints  have less odor than oils.  Use adequate ventilation while painting.  Permanents & Hair Color:  Chemicals in hair dyes are not recommended as they cause increase hair dryness which can increase hair loss during pregnancy.  " Highlighting" and permanents are allowed.  Dye may be absorbed differently and permanents may not hold as well during pregnancy.  Sunbathing:  Use a sunscreen, as skin burns easily during pregnancy.  Drink plenty of fluids; avoid over heating.  Tanning Beds:  Because their possible side effects are still unknown, tanning beds are not recommended.  Ultrasound Scans:  Routine ultrasounds are performed at approximately 20 weeks.  You will be able to see your baby's general anatomy an if you would like to know the gender this can usually be determined as well.  If it is questionable when you conceived you may also receive an ultrasound early in your pregnancy for dating purposes.  Otherwise ultrasound exams   are not routinely performed unless there is a medical necessity.  Although you can request a scan we ask that you pay for it when conducted because insurance does not cover " patient request" scans.  Work: If your pregnancy proceeds without complications you may work until your due date, unless your physician or employer advises otherwise.  Round Ligament Pain/Pelvic Discomfort:  Sharp, shooting pains not associated with bleeding are fairly common, usually occurring in the second trimester of pregnancy.  They tend to be worse when standing up or when you remain standing for long periods of time.  These are the result of pressure of certain pelvic ligaments called "round ligaments".  Rest, Tylenol and heat seem to be the most effective relief.  As the womb and fetus grow, they rise out of the pelvis and the discomfort improves.  Please notify the office if your pain seems different than that described.  It may represent a more serious condition.   

## 2017-02-17 LAB — CBC WITH DIFFERENTIAL/PLATELET
Basophils Absolute: 0 10*3/uL (ref 0.0–0.2)
Basos: 1 %
EOS (ABSOLUTE): 0.2 10*3/uL (ref 0.0–0.4)
Eos: 3 %
HEMATOCRIT: 34.4 % (ref 34.0–46.6)
Hemoglobin: 11.2 g/dL (ref 11.1–15.9)
IMMATURE GRANULOCYTES: 0 %
Immature Grans (Abs): 0 10*3/uL (ref 0.0–0.1)
Lymphocytes Absolute: 2.6 10*3/uL (ref 0.7–3.1)
Lymphs: 40 %
MCH: 28.7 pg (ref 26.6–33.0)
MCHC: 32.6 g/dL (ref 31.5–35.7)
MCV: 88 fL (ref 79–97)
MONOCYTES: 7 %
MONOS ABS: 0.4 10*3/uL (ref 0.1–0.9)
NEUTROS PCT: 49 %
Neutrophils Absolute: 3.2 10*3/uL (ref 1.4–7.0)
PLATELETS: 244 10*3/uL (ref 150–379)
RBC: 3.9 x10E6/uL (ref 3.77–5.28)
RDW: 14 % (ref 12.3–15.4)
WBC: 6.5 10*3/uL (ref 3.4–10.8)

## 2017-02-17 LAB — HEMOGLOBIN A1C
Est. average glucose Bld gHb Est-mCnc: 100 mg/dL
Hgb A1c MFr Bld: 5.1 % (ref 4.8–5.6)

## 2017-02-17 LAB — RH TYPE: RH TYPE: POSITIVE

## 2017-02-17 LAB — TSH: TSH: 0.727 u[IU]/mL (ref 0.450–4.500)

## 2017-02-17 LAB — RPR: RPR Ser Ql: NONREACTIVE

## 2017-02-17 LAB — VARICELLA ZOSTER ANTIBODY, IGG: VARICELLA: 2732 {index} (ref >165)

## 2017-02-17 LAB — HEPATITIS B SURFACE ANTIGEN: Hepatitis B Surface Ag: NEGATIVE

## 2017-02-17 LAB — ABO

## 2017-02-17 LAB — RUBELLA SCREEN: Rubella Antibodies, IGG: 1.42 index (ref >0.99)

## 2017-02-17 LAB — ANTIBODY SCREEN: ANTIBODY SCREEN: NEGATIVE

## 2017-02-17 LAB — SICKLE CELL SCREEN: Sickle Cell Screen: NEGATIVE

## 2017-02-17 LAB — HIV ANTIBODY (ROUTINE TESTING W REFLEX): HIV SCREEN 4TH GENERATION: NONREACTIVE

## 2017-02-18 LAB — GC/CHLAMYDIA PROBE AMP
Chlamydia trachomatis, NAA: NEGATIVE
NEISSERIA GONORRHOEAE BY PCR: NEGATIVE

## 2017-02-18 LAB — URINE CULTURE, OB REFLEX

## 2017-02-18 LAB — CULTURE, OB URINE

## 2017-02-19 LAB — MONITOR DRUG PROFILE 14(MW)
Amphetamine Scrn, Ur: NEGATIVE ng/mL
BARBITURATE SCREEN URINE: NEGATIVE ng/mL
BENZODIAZEPINE SCREEN, URINE: NEGATIVE ng/mL
Buprenorphine, Urine: NEGATIVE ng/mL
CANNABINOIDS UR QL SCN: NEGATIVE ng/mL
COCAINE(METAB.)SCREEN, URINE: NEGATIVE ng/mL
CREATININE(CRT), U: 172.6 mg/dL (ref 20.0–300.0)
Fentanyl, Urine: NEGATIVE pg/mL
METHADONE SCREEN, URINE: NEGATIVE ng/mL
Meperidine Screen, Urine: NEGATIVE ng/mL
OPIATE SCREEN URINE: NEGATIVE ng/mL
OXYCODONE+OXYMORPHONE UR QL SCN: NEGATIVE ng/mL
PHENCYCLIDINE QUANTITATIVE URINE: NEGATIVE ng/mL
Ph of Urine: 6.3 (ref 4.5–8.9)
Propoxyphene Scrn, Ur: NEGATIVE ng/mL
SPECIFIC GRAVITY: 1.034
TRAMADOL SCREEN, URINE: NEGATIVE ng/mL

## 2017-02-19 LAB — URINALYSIS, ROUTINE W REFLEX MICROSCOPIC
BILIRUBIN UA: NEGATIVE
Glucose, UA: NEGATIVE
Ketones, UA: NEGATIVE
LEUKOCYTES UA: NEGATIVE
Nitrite, UA: NEGATIVE
PH UA: 6.5 (ref 5.0–7.5)
PROTEIN UA: NEGATIVE
RBC, UA: NEGATIVE
Specific Gravity, UA: >=1.03 — AB (ref 1.005–1.030)
Urobilinogen, Ur: 0.2 mg/dL (ref 0.2–1.0)

## 2017-02-19 LAB — NICOTINE SCREEN, URINE: Cotinine Ql Scrn, Ur: NEGATIVE ng/mL

## 2017-03-13 ENCOUNTER — Encounter: Payer: Self-pay | Admitting: Obstetrics and Gynecology

## 2017-03-13 ENCOUNTER — Ambulatory Visit (INDEPENDENT_AMBULATORY_CARE_PROVIDER_SITE_OTHER): Payer: BLUE CROSS/BLUE SHIELD | Admitting: Obstetrics and Gynecology

## 2017-03-13 VITALS — BP 129/88 | HR 66 | Wt 193.5 lb

## 2017-03-13 DIAGNOSIS — O09299 Supervision of pregnancy with other poor reproductive or obstetric history, unspecified trimester: Secondary | ICD-10-CM | POA: Insufficient documentation

## 2017-03-13 DIAGNOSIS — Z1379 Encounter for other screening for genetic and chromosomal anomalies: Secondary | ICD-10-CM

## 2017-03-13 DIAGNOSIS — E669 Obesity, unspecified: Secondary | ICD-10-CM | POA: Insufficient documentation

## 2017-03-13 DIAGNOSIS — Z3482 Encounter for supervision of other normal pregnancy, second trimester: Secondary | ICD-10-CM | POA: Diagnosis not present

## 2017-03-13 LAB — POCT URINALYSIS DIPSTICK
Bilirubin, UA: NEGATIVE
Glucose, UA: NEGATIVE
KETONES UA: NEGATIVE
LEUKOCYTES UA: NEGATIVE
NITRITE UA: NEGATIVE
PH UA: 6.5 (ref 5.0–8.0)
Spec Grav, UA: 1.025 (ref 1.010–1.025)
UROBILINOGEN UA: 0.2 U/dL
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
## 2017-03-14 NOTE — Progress Notes (Signed)
I have reviewed the record and concur with patient management and plan.  Louisa Favaro, MD [facility]     

## 2017-03-22 ENCOUNTER — Other Ambulatory Visit: Payer: BLUE CROSS/BLUE SHIELD

## 2017-03-22 ENCOUNTER — Other Ambulatory Visit: Payer: Self-pay | Admitting: Obstetrics and Gynecology

## 2017-03-22 ENCOUNTER — Ambulatory Visit: Payer: BLUE CROSS/BLUE SHIELD

## 2017-03-22 DIAGNOSIS — Z3491 Encounter for supervision of normal pregnancy, unspecified, first trimester: Secondary | ICD-10-CM | POA: Diagnosis not present

## 2017-03-22 DIAGNOSIS — O3680X0 Pregnancy with inconclusive fetal viability, not applicable or unspecified: Secondary | ICD-10-CM

## 2017-03-22 DIAGNOSIS — Z1379 Encounter for other screening for genetic and chromosomal anomalies: Secondary | ICD-10-CM

## 2017-04-10 ENCOUNTER — Other Ambulatory Visit: Payer: Self-pay | Admitting: Obstetrics and Gynecology

## 2017-04-10 ENCOUNTER — Encounter: Payer: Self-pay | Admitting: Obstetrics and Gynecology

## 2017-04-10 ENCOUNTER — Ambulatory Visit (INDEPENDENT_AMBULATORY_CARE_PROVIDER_SITE_OTHER): Payer: BLUE CROSS/BLUE SHIELD | Admitting: Obstetrics and Gynecology

## 2017-04-10 VITALS — BP 107/67 | HR 82 | Wt 194.4 lb

## 2017-04-10 DIAGNOSIS — Z3482 Encounter for supervision of other normal pregnancy, second trimester: Secondary | ICD-10-CM

## 2017-04-10 DIAGNOSIS — O3680X0 Pregnancy with inconclusive fetal viability, not applicable or unspecified: Secondary | ICD-10-CM

## 2017-04-10 DIAGNOSIS — E669 Obesity, unspecified: Secondary | ICD-10-CM

## 2017-04-10 LAB — POCT URINALYSIS DIPSTICK
BILIRUBIN UA: NEGATIVE
Blood, UA: NEGATIVE
GLUCOSE UA: NEGATIVE
KETONES UA: NEGATIVE
LEUKOCYTES UA: NEGATIVE
Nitrite, UA: NEGATIVE
PH UA: 5 (ref 5.0–8.0)
Protein, UA: NEGATIVE
Spec Grav, UA: 1.025 (ref 1.010–1.025)
Urobilinogen, UA: 0.2 E.U./dL

## 2017-04-10 NOTE — Progress Notes (Signed)
ROB:  Quad and 1hrGCT today.  (discusssed [id number] -pt not sure)  Also considering tubal vs IUD after pregnancy. Advised to start ASA 81.  FAS scheduled for 2 weeks.

## 2017-04-11 LAB — GLUCOSE, 1 HOUR GESTATIONAL: Gestational Diabetes Screen: 147 mg/dL — ABNORMAL HIGH (ref 65–139)

## 2017-04-13 LAB — AFP, SERUM, OPEN SPINA BIFIDA
AFP MOM: 1.23
AFP Value: 45.5 ng/mL
Gest. Age on Collection Date: 17.4 weeks
MATERNAL AGE AT EDD: 30.9 a
OSBR Risk 1 IN: 10000
TEST RESULTS AFP: NEGATIVE
Weight: 194 [lb_av]

## 2017-04-24 ENCOUNTER — Other Ambulatory Visit: Payer: BLUE CROSS/BLUE SHIELD

## 2017-04-25 ENCOUNTER — Ambulatory Visit (INDEPENDENT_AMBULATORY_CARE_PROVIDER_SITE_OTHER): Payer: BLUE CROSS/BLUE SHIELD

## 2017-04-25 DIAGNOSIS — Z3482 Encounter for supervision of other normal pregnancy, second trimester: Secondary | ICD-10-CM

## 2017-05-09 ENCOUNTER — Ambulatory Visit (INDEPENDENT_AMBULATORY_CARE_PROVIDER_SITE_OTHER): Payer: BLUE CROSS/BLUE SHIELD | Admitting: Obstetrics and Gynecology

## 2017-05-09 VITALS — BP 107/69 | HR 80 | Wt 198.0 lb

## 2017-05-09 DIAGNOSIS — Z3482 Encounter for supervision of other normal pregnancy, second trimester: Secondary | ICD-10-CM | POA: Diagnosis not present

## 2017-05-09 DIAGNOSIS — E669 Obesity, unspecified: Secondary | ICD-10-CM

## 2017-05-09 LAB — POCT URINALYSIS DIPSTICK
Bilirubin, UA: NEGATIVE
Glucose, UA: NEGATIVE
Ketones, UA: NEGATIVE
NITRITE UA: NEGATIVE
Spec Grav, UA: 1.02 (ref 1.010–1.025)
UROBILINOGEN UA: 0.2 U/dL
pH, UA: 6.5 (ref 5.0–8.0)

## 2017-05-09 NOTE — Progress Notes (Signed)
ROB: Patient doing well, no complaints.  Patient notes she could not tolerate the baby aspirin (started causing migraines after 3-4 days of use).  Notes cessation of migraines after discontinuation of aspirin.  Advised that she does not need to try to resume at this time. S/p normal anatomy scan. RTC in 4 weeks.

## 2017-06-06 ENCOUNTER — Encounter: Payer: Self-pay | Admitting: Obstetrics and Gynecology

## 2017-06-06 ENCOUNTER — Ambulatory Visit (INDEPENDENT_AMBULATORY_CARE_PROVIDER_SITE_OTHER): Payer: BLUE CROSS/BLUE SHIELD | Admitting: Obstetrics and Gynecology

## 2017-06-06 VITALS — BP 116/76 | HR 82 | Wt 197.8 lb

## 2017-06-06 DIAGNOSIS — Z3482 Encounter for supervision of other normal pregnancy, second trimester: Secondary | ICD-10-CM

## 2017-06-06 DIAGNOSIS — Z131 Encounter for screening for diabetes mellitus: Secondary | ICD-10-CM

## 2017-06-06 DIAGNOSIS — Z130 Encounter for screening for diseases of the blood and blood-forming organs and certain disorders involving the immune mechanism: Secondary | ICD-10-CM

## 2017-06-06 LAB — POCT URINALYSIS DIPSTICK
Bilirubin, UA: NEGATIVE
Blood, UA: NEGATIVE
Glucose, UA: NEGATIVE
KETONES UA: NEGATIVE
NITRITE UA: NEGATIVE
PROTEIN UA: NEGATIVE
Spec Grav, UA: 1.02 (ref 1.010–1.025)
Urobilinogen, UA: 0.2 E.U./dL
pH, UA: 6 (ref 5.0–8.0)

## 2017-06-06 NOTE — Progress Notes (Signed)
Rob- Declines flu vaccine today.

## 2017-06-06 NOTE — Progress Notes (Signed)
ROB :  No problems today.  Declined flu shot - will get it "next time".  Scheduled for 1 hr GCT 3 weeks and ROB 4.

## 2017-06-26 ENCOUNTER — Other Ambulatory Visit: Payer: BLUE CROSS/BLUE SHIELD

## 2017-06-26 DIAGNOSIS — Z131 Encounter for screening for diabetes mellitus: Secondary | ICD-10-CM

## 2017-06-26 DIAGNOSIS — Z130 Encounter for screening for diseases of the blood and blood-forming organs and certain disorders involving the immune mechanism: Secondary | ICD-10-CM

## 2017-06-27 LAB — CBC
HEMATOCRIT: 31.7 % — AB (ref 34.0–46.6)
Hemoglobin: 10 g/dL — ABNORMAL LOW (ref 11.1–15.9)
MCH: 27.7 pg (ref 26.6–33.0)
MCHC: 31.5 g/dL (ref 31.5–35.7)
MCV: 88 fL (ref 79–97)
Platelets: 227 10*3/uL (ref 150–379)
RBC: 3.61 x10E6/uL — ABNORMAL LOW (ref 3.77–5.28)
RDW: 13.8 % (ref 12.3–15.4)
WBC: 7.2 10*3/uL (ref 3.4–10.8)

## 2017-06-27 LAB — GLUCOSE, 1 HOUR GESTATIONAL: Gestational Diabetes Screen: 89 mg/dL (ref 65–139)

## 2017-07-04 ENCOUNTER — Ambulatory Visit (INDEPENDENT_AMBULATORY_CARE_PROVIDER_SITE_OTHER): Payer: BLUE CROSS/BLUE SHIELD | Admitting: Obstetrics and Gynecology

## 2017-07-04 ENCOUNTER — Encounter: Payer: Self-pay | Admitting: Obstetrics and Gynecology

## 2017-07-04 VITALS — BP 117/79 | HR 87 | Wt 199.1 lb

## 2017-07-04 DIAGNOSIS — E669 Obesity, unspecified: Secondary | ICD-10-CM

## 2017-07-04 DIAGNOSIS — O99013 Anemia complicating pregnancy, third trimester: Secondary | ICD-10-CM

## 2017-07-04 DIAGNOSIS — Z3483 Encounter for supervision of other normal pregnancy, third trimester: Secondary | ICD-10-CM

## 2017-07-04 LAB — POCT URINALYSIS DIPSTICK
Bilirubin, UA: NEGATIVE
Glucose, UA: NEGATIVE
Ketones, UA: NEGATIVE
Leukocytes, UA: NEGATIVE
Nitrite, UA: NEGATIVE
PH UA: 6.5 (ref 5.0–8.0)
PROTEIN UA: NEGATIVE
RBC UA: NEGATIVE
SPEC GRAV UA: 1.025 (ref 1.010–1.025)
UROBILINOGEN UA: 0.2 U/dL

## 2017-07-04 NOTE — Progress Notes (Signed)
ROB: Doing well, no complaints. Normal glucola, mild anemia noted.  Discussed increasing iron in diet (patient declined iron tablets due to side effects).  Doing well monitoring weight gain. Desires to breastfeed, desires unsure method for contraception. Handouts given for each. For Tdap next visit (out of stock today), signed blood consent, discussed cord blood banking. Declines flu vaccine. RTC in 2 weeks.

## 2017-07-04 NOTE — Patient Instructions (Addendum)
Breastfeeding Deciding to breastfeed is one of the best choices you can make for you and your baby. A change in hormones during pregnancy causes your breast tissue to grow and increases the number and size of your milk ducts. These hormones also allow proteins, sugars, and fats from your blood supply to make breast milk in your milk-producing glands. Hormones prevent breast milk from being released before your baby is born as well as prompt milk flow after birth. Once breastfeeding has begun, thoughts of your baby, as well as his or her sucking or crying, can stimulate the release of milk from your milk-producing glands. Benefits of breastfeeding For Your Baby  Your first milk (colostrum) helps your baby's digestive system function better.  There are antibodies in your milk that help your baby fight off infections.  Your baby has a lower incidence of asthma, allergies, and sudden infant death syndrome.  The nutrients in breast milk are better for your baby than infant formulas and are designed uniquely for your baby's needs.  Breast milk improves your baby's brain development.  Your baby is less likely to develop other conditions, such as childhood obesity, asthma, or type 2 diabetes mellitus.  For You  Breastfeeding helps to create a very special bond between you and your baby.  Breastfeeding is convenient. Breast milk is always available at the correct temperature and costs nothing.  Breastfeeding helps to burn calories and helps you lose the weight gained during pregnancy.  Breastfeeding makes your uterus contract to its prepregnancy size faster and slows bleeding (lochia) after you give birth.  Breastfeeding helps to lower your risk of developing type 2 diabetes mellitus, osteoporosis, and breast or ovarian cancer later in life.  Signs that your baby is hungry Early Signs of Hunger  Increased alertness or activity.  Stretching.  Movement of the head from side to  side.  Movement of the head and opening of the mouth when the corner of the mouth or cheek is stroked (rooting).  Increased sucking sounds, smacking lips, cooing, sighing, or squeaking.  Hand-to-mouth movements.  Increased sucking of fingers or hands.  Late Signs of Hunger  Fussing.  Intermittent crying.  Extreme Signs of Hunger Signs of extreme hunger will require calming and consoling before your baby will be able to breastfeed successfully. Do not wait for the following signs of extreme hunger to occur before you initiate breastfeeding:  Restlessness.  A loud, strong cry.  Screaming.  Breastfeeding basics Breastfeeding Initiation  Find a comfortable place to sit or lie down, with your neck and back well supported.  Place a pillow or rolled up blanket under your baby to bring him or her to the level of your breast (if you are seated). Nursing pillows are specially designed to help support your arms and your baby while you breastfeed.  Make sure that your baby's abdomen is facing your abdomen.  Gently massage your breast. With your fingertips, massage from your chest wall toward your nipple in a circular motion. This encourages milk flow. You may need to continue this action during the feeding if your milk flows slowly.  Support your breast with 4 fingers underneath and your thumb above your nipple. Make sure your fingers are well away from your nipple and your baby's mouth.  Stroke your baby's lips gently with your finger or nipple.  When your baby's mouth is open wide enough, quickly bring your baby to your breast, placing your entire nipple and as much of the colored area  around your nipple (areola) as possible into your baby's mouth. ? More areola should be visible above your baby's upper lip than below the lower lip. ? Your baby's tongue should be between his or her lower gum and your breast.  Ensure that your baby's mouth is correctly positioned around your nipple  (latched). Your baby's lips should create a seal on your breast and be turned out (everted).  It is common for your baby to suck about 2-3 minutes in order to start the flow of breast milk.  Latching Teaching your baby how to latch on to your breast properly is very important. An improper latch can cause nipple pain and decreased milk supply for you and poor weight gain in your baby. Also, if your baby is not latched onto your nipple properly, he or she may swallow some air during feeding. This can make your baby fussy. Burping your baby when you switch breasts during the feeding can help to get rid of the air. However, teaching your baby to latch on properly is still the best way to prevent fussiness from swallowing air while breastfeeding. Signs that your baby has successfully latched on to your nipple:  Silent tugging or silent sucking, without causing you pain.  Swallowing heard between every 3-4 sucks.  Muscle movement above and in front of his or her ears while sucking.  Signs that your baby has not successfully latched on to nipple:  Sucking sounds or smacking sounds from your baby while breastfeeding.  Nipple pain.  If you think your baby has not latched on correctly, slip your finger into the corner of your baby's mouth to break the suction and place it between your baby's gums. Attempt breastfeeding initiation again. Signs of Successful Breastfeeding Signs from your baby:  A gradual decrease in the number of sucks or complete cessation of sucking.  Falling asleep.  Relaxation of his or her body.  Retention of a small amount of milk in his or her mouth.  Letting go of your breast by himself or herself.  Signs from you:  Breasts that have increased in firmness, weight, and size 1-3 hours after feeding.  Breasts that are softer immediately after breastfeeding.  Increased milk volume, as well as a change in milk consistency and color by the fifth day of  breastfeeding.  Nipples that are not sore, cracked, or bleeding.  Signs That Your [employer] Books is Getting Enough Milk  Wetting at least 1-2 diapers during the first 24 hours after birth.  Wetting at least 5-6 diapers every 24 hours for the first week after birth. The urine should be clear or pale yellow by 5 days after birth.  Wetting 6-8 diapers every 24 hours as your baby continues to grow and develop.  At least 3 stools in a 24-hour period by age 762 days. The stool should be soft and yellow.  At least 3 stools in a 24-hour period by age 765 days. The stool should be seedy and yellow.  No loss of weight greater than 10% of birth weight during the first 46 days of age.  Average weight gain of 4-7 ounces (113-198 g) per week after age 76 days.  Consistent daily weight gain by age 32 days, without weight loss after the age of 2 weeks.  After a feeding, your baby may spit up a small amount. This is common. Breastfeeding frequency and duration Frequent feeding will help you make more milk and can prevent sore nipples and breast engorgement. Breastfeed when  you feel the need to reduce the fullness of your breasts or when your baby shows signs of hunger. This is called "breastfeeding on demand." Avoid introducing a pacifier to your baby while you are working to establish breastfeeding (the first 4-6 weeks after your baby is born). After this time you may choose to use a pacifier. Research has shown that pacifier use during the first year of a baby's life decreases the risk of sudden infant death syndrome (SIDS). Allow your baby to feed on each breast as long as he or she wants. Breastfeed until your baby is finished feeding. When your baby unlatches or falls asleep while feeding from the first breast, offer the second breast. Because newborns are often sleepy in the first few weeks of life, you may need to awaken your baby to get him or her to feed. Breastfeeding times will vary from baby to baby. However,  the following rules can serve as a guide to help you ensure that your baby is properly fed:  Newborns (babies 59 weeks of age or younger) may breastfeed every 1-3 hours.  Newborns should not go longer than 3 hours during the day or 5 hours during the night without breastfeeding.  You should breastfeed your baby a minimum of 8 times in a 24-hour period until you begin to introduce solid foods to your baby at around 42 months of age.  Breast milk pumping Pumping and storing breast milk allows you to ensure that your baby is exclusively fed your breast milk, even at times when you are unable to breastfeed. This is especially important if you are going back to work while you are still breastfeeding or when you are not able to be present during feedings. Your lactation consultant can give you guidelines on how long it is safe to store breast milk. A breast pump is a machine that allows you to pump milk from your breast into a sterile bottle. The pumped breast milk can then be stored in a refrigerator or freezer. Some breast pumps are operated by hand, while others use electricity. Ask your lactation consultant which type will work best for you. Breast pumps can be purchased, but some hospitals and breastfeeding support groups lease breast pumps on a monthly basis. A lactation consultant can teach you how to hand express breast milk, if you prefer not to use a pump. Caring for your breasts while you breastfeed Nipples can become dry, cracked, and sore while breastfeeding. The following recommendations can help keep your breasts moisturized and healthy:  Avoid using soap on your nipples.  Wear a supportive bra. Although not required, special nursing bras and tank tops are designed to allow access to your breasts for breastfeeding without taking off your entire bra or top. Avoid wearing underwire-style bras or extremely tight bras.  Air dry your nipples for 3-35mnutes after each feeding.  Use only cotton  bra pads to absorb leaked breast milk. Leaking of breast milk between feedings is normal.  Use lanolin on your nipples after breastfeeding. Lanolin helps to maintain your skin's normal moisture barrier. If you use pure lanolin, you do not need to wash it off before feeding your baby again. Pure lanolin is not toxic to your baby. You may also hand express a few drops of breast milk and gently massage that milk into your nipples and allow the milk to air dry.  In the first few weeks after giving birth, some women experience extremely full breasts (engorgement). Engorgement can make your  breasts feel heavy, warm, and tender to the touch. Engorgement peaks within 3-5 days after you give birth. The following recommendations can help ease engorgement:  Completely empty your breasts while breastfeeding or pumping. You may want to start by applying warm, moist heat (in the shower or with warm water-soaked hand towels) just before feeding or pumping. This increases circulation and helps the milk flow. If your baby does not completely empty your breasts while breastfeeding, pump any extra milk after he or she is finished.  Wear a snug bra (nursing or regular) or tank top for 1-2 days to signal your body to slightly decrease milk production.  Apply ice packs to your breasts, unless this is too uncomfortable for you.  Make sure that your baby is latched on and positioned properly while breastfeeding.  If engorgement persists after 48 hours of following these recommendations, contact your health care provider or a Science writer. Overall health care recommendations while breastfeeding  Eat healthy foods. Alternate between meals and snacks, eating 3 of each per day. Because what you eat affects your breast milk, some of the foods may make your baby more irritable than usual. Avoid eating these foods if you are sure that they are negatively affecting your baby.  Drink milk, fruit juice, and water to  satisfy your thirst (about 10 glasses a day).  Rest often, relax, and continue to take your prenatal vitamins to prevent fatigue, stress, and anemia.  Continue breast self-awareness checks.  Avoid chewing and smoking tobacco. Chemicals from cigarettes that pass into breast milk and exposure to secondhand smoke may harm your baby.  Avoid alcohol and drug use, including marijuana. Some medicines that may be harmful to your baby can pass through breast milk. It is important to ask your health care provider before taking any medicine, including all over-the-counter and prescription medicine as well as vitamin and herbal supplements. It is possible to become pregnant while breastfeeding. If birth control is desired, ask your health care provider about options that will be safe for your baby. Contact a health care provider if:  You feel like you want to stop breastfeeding or have become frustrated with breastfeeding.  You have painful breasts or nipples.  Your nipples are cracked or bleeding.  Your breasts are red, tender, or warm.  You have a swollen area on either breast.  You have a fever or chills.  You have nausea or vomiting.  You have drainage other than breast milk from your nipples.  Your breasts do not become full before feedings by the fifth day after you give birth.  You feel sad and depressed.  Your baby is too sleepy to eat well.  Your baby is having trouble sleeping.  Your baby is wetting less than 3 diapers in a 24-hour period.  Your baby has less than 3 stools in a 24-hour period.  Your baby's skin or the white part of his or her eyes becomes yellow.  Your baby is not gaining weight by 24 days of age. Get help right away if:  Your baby is overly tired (lethargic) and does not want to wake up and feed.  Your baby develops an unexplained fever. This information is not intended to replace advice given to you by your health care provider. Make sure you discuss  any questions you have with your health care provider. Document Released: 08/21/2005 Document Revised: 02/02/2016 Document Reviewed: 02/12/2013 Elsevier Interactive Patient Education  2017 Else[employer] Inc[employer]  Iron-Rich Diet Iron is a mineral  that helps your body to produce hemoglobin. Hemoglobin is a protein in your red blood cells that carries oxygen to your body's tissues. Eating too little iron may cause you to feel weak and tired, and it can increase your risk for infection. Eating enough iron is necessary for your body's metabolism, muscle function, and nervous system. Iron is naturally found in many foods. It can also be added to foods or fortified in foods. There are two types of dietary iron:  Heme iron. Heme iron is absorbed by the body more easily than nonheme iron. Heme iron is found in meat, poultry, and fish.  Nonheme iron. Nonheme iron is found in dietary supplements, iron-fortified grains, beans, and vegetables.  You may need to follow an iron-rich diet if:  You have been diagnosed with iron deficiency or iron-deficiency anemia.  You have a condition that prevents you from absorbing dietary iron, such as: ? Infection in your intestines. ? Celiac disease. This involves long-lasting (chronic) inflammation of your intestines.  You do not eat enough iron.  You eat a diet that is high in foods that impair iron absorption.  You have lost a lot of blood.  You have heavy bleeding during your menstrual cycle.  You are pregnant.  What is my plan? Your health care provider may help you to determine how much iron you need per day based on your condition. Generally, when a person consumes sufficient amounts of iron in the diet, the following iron needs are met:  Men. ? 58-11 years old: 11 mg per day. ? 72-7 years old: 8 mg per day.  Women. ? 17-74 years old: 15 mg per day. ? 56-72 years old: 18 mg per day. ? Over 34 years old: 8 mg per day. ? Pregnant women: 27 mg per  day. ? Breastfeeding women: 9 mg per day.  What do I need to know about an iron-rich diet?  Eat fresh fruits and vegetables that are high in vitamin C along with foods that are high in iron. This will help increase the amount of iron that your body absorbs from food, especially with foods containing nonheme iron. Foods that are high in vitamin C include oranges, peppers, tomatoes, and mango.  Take iron supplements only as directed by your health care provider. Overdose of iron can be life-threatening. If you were prescribed iron supplements, take them with orange juice or a vitamin C supplement.  Cook foods in pots and pans that are made from iron.  Eat nonheme iron-containing foods alongside foods that are high in heme iron. This helps to improve your iron absorption.  Certain foods and drinks contain compounds that impair iron absorption. Avoid eating these foods in the same meal as iron-rich foods or with iron supplements. These include: ? Coffee, black tea, and red wine. ? Milk, dairy products, and foods that are high in calcium. ? Beans, soybeans, and peas. ? Whole grains.  When eating foods that contain both nonheme iron and compounds that impair iron absorption, follow these tips to absorb iron better. ? Soak beans overnight before cooking. ? Soak whole grains overnight and drain them before using. ? Ferment flours before baking, such as using yeast in bread dough. What foods can I eat? Grains Iron-fortified breakfast cereal. Iron-fortified whole-wheat bread. Enriched rice. Sprouted grains. Vegetables Spinach. Potatoes with skin. Green peas. Broccoli. Red and green bell peppers. Fermented vegetables. Fruits Prunes. Raisins. Oranges. Strawberries. Mango. Grapefruit. Meats and Other Protein Sources Beef liver. Oysters. Beef. Shrimp. [place].  Chicken. [place]. Sardines. Chickpeas. Nuts. Tofu. Beverages Tomato juice. Fresh orange juice. Prune juice. Hibiscus tea. Fortified instant  breakfast shakes. Condiments Tahini. Fermented soy sauce. Sweets and Desserts Black-strap molasses. Other Wheat germ. The items listed above may not be a complete list of recommended foods or beverages. Contact your dietitian for more options. What foods are not recommended? Grains Whole grains. Bran cereal. Bran flour. Oats. Vegetables Artichokes. Brussels sprouts. Kale. Fruits Blueberries. Raspberries. Strawberries. Figs. Meats and Other Protein Sources Soybeans. Products made from soy protein. Dairy Milk. Cream. Cheese. Yogurt. Cottage cheese. Beverages Coffee. Black tea. Red wine. Sweets and Desserts Cocoa. Chocolate. Ice cream. Other Basil. Oregano. Parsley. The items listed above may not be a complete list of foods and beverages to avoid. Contact your dietitian for more information. This information is not intended to replace advice given to you by your health care provider. Make sure you discuss any questions you have with your health care provider. Document Released: 04/04/2005 Document Revised: 03/10/2016 Document Reviewed: 03/18/2014 Elsevier Interactive Patient Education  [employer].

## 2017-07-04 NOTE — Progress Notes (Signed)
ROB- Patient states she is doing well, no complaints, flu-declines

## 2017-07-18 ENCOUNTER — Encounter: Payer: Self-pay | Admitting: Obstetrics and Gynecology

## 2017-07-18 ENCOUNTER — Ambulatory Visit (INDEPENDENT_AMBULATORY_CARE_PROVIDER_SITE_OTHER): Payer: BLUE CROSS/BLUE SHIELD | Admitting: Obstetrics and Gynecology

## 2017-07-18 VITALS — BP 120/55 | HR 84 | Wt 202.0 lb

## 2017-07-18 DIAGNOSIS — Z3483 Encounter for supervision of other normal pregnancy, third trimester: Secondary | ICD-10-CM

## 2017-07-18 DIAGNOSIS — Z23 Encounter for immunization: Secondary | ICD-10-CM

## 2017-07-18 LAB — POCT URINALYSIS DIPSTICK
Bilirubin, UA: NEGATIVE
Glucose, UA: NEGATIVE
KETONES UA: NEGATIVE
Leukocytes, UA: NEGATIVE
Nitrite, UA: NEGATIVE
PH UA: 7 (ref 5.0–8.0)
PROTEIN UA: NEGATIVE
RBC UA: NEGATIVE
SPEC GRAV UA: 1.02 (ref 1.010–1.025)
UROBILINOGEN UA: 0.2 U/dL

## 2017-07-18 NOTE — Progress Notes (Signed)
ROB: Denies problems.  Tdap today.  Reports active fetal movement.

## 2017-08-01 ENCOUNTER — Ambulatory Visit (INDEPENDENT_AMBULATORY_CARE_PROVIDER_SITE_OTHER): Payer: BLUE CROSS/BLUE SHIELD | Admitting: Obstetrics and Gynecology

## 2017-08-01 ENCOUNTER — Encounter: Payer: Self-pay | Admitting: Obstetrics and Gynecology

## 2017-08-01 VITALS — BP 131/76 | HR 76 | Wt 203.2 lb

## 2017-08-01 DIAGNOSIS — Z3483 Encounter for supervision of other normal pregnancy, third trimester: Secondary | ICD-10-CM

## 2017-08-01 LAB — POCT URINALYSIS DIPSTICK
BILIRUBIN UA: NEGATIVE
Blood, UA: NEGATIVE
GLUCOSE UA: NEGATIVE
NITRITE UA: NEGATIVE
Spec Grav, UA: 1.02 (ref 1.010–1.025)
UROBILINOGEN UA: 0.2 U/dL
pH, UA: 6.5 (ref 5.0–8.0)

## 2017-08-01 NOTE — Progress Notes (Signed)
ROB- Pt states she is doing well 

## 2017-08-01 NOTE — Progress Notes (Signed)
ROB: Doing well, no complaints. RTC in 2 weeks.

## 2017-08-15 ENCOUNTER — Encounter: Payer: Self-pay | Admitting: Obstetrics and Gynecology

## 2017-08-15 ENCOUNTER — Ambulatory Visit (INDEPENDENT_AMBULATORY_CARE_PROVIDER_SITE_OTHER): Payer: BLUE CROSS/BLUE SHIELD | Admitting: Obstetrics and Gynecology

## 2017-08-15 VITALS — BP 127/74 | HR 91 | Wt 202.4 lb

## 2017-08-15 DIAGNOSIS — Z3483 Encounter for supervision of other normal pregnancy, third trimester: Secondary | ICD-10-CM

## 2017-08-15 LAB — POCT URINALYSIS DIPSTICK
BILIRUBIN UA: NEGATIVE
Blood, UA: NEGATIVE
GLUCOSE UA: NEGATIVE
KETONES UA: NEGATIVE
Leukocytes, UA: NEGATIVE
Nitrite, UA: NEGATIVE
Protein, UA: NEGATIVE
Spec Grav, UA: 1.01 (ref 1.010–1.025)
Urobilinogen, UA: 0.2 E.U./dL
pH, UA: 5 (ref 5.0–8.0)

## 2017-08-15 NOTE — Progress Notes (Signed)
ROB: No complaints.  GC/CT-GBS performed. ?

## 2017-08-17 LAB — GC/CHLAMYDIA PROBE AMP
CHLAMYDIA, DNA PROBE: NEGATIVE
Neisseria gonorrhoeae by PCR: NEGATIVE

## 2017-08-17 LAB — STREP GP B NAA: STREP GROUP B AG: NEGATIVE

## 2017-08-31 ENCOUNTER — Encounter: Payer: Self-pay | Admitting: Obstetrics and Gynecology

## 2017-08-31 ENCOUNTER — Ambulatory Visit (INDEPENDENT_AMBULATORY_CARE_PROVIDER_SITE_OTHER): Payer: BLUE CROSS/BLUE SHIELD | Admitting: Obstetrics and Gynecology

## 2017-08-31 VITALS — BP 134/77 | HR 90 | Wt 202.1 lb

## 2017-08-31 DIAGNOSIS — Z3483 Encounter for supervision of other normal pregnancy, third trimester: Secondary | ICD-10-CM

## 2017-08-31 LAB — POCT URINALYSIS DIPSTICK
BILIRUBIN UA: NEGATIVE
GLUCOSE UA: NEGATIVE
Nitrite, UA: NEGATIVE
Spec Grav, UA: 1.025 (ref 1.010–1.025)
UROBILINOGEN UA: 0.2 U/dL
pH, UA: 7 (ref 5.0–8.0)

## 2017-08-31 NOTE — Progress Notes (Signed)
ROB: Notes pressure and cramping.  GBS negative. Discussed labor precautions.

## 2017-08-31 NOTE — Progress Notes (Signed)
ROB- Pt has had lots of pressure and cramping

## 2017-09-04 NOTE — L&D Delivery Note (Signed)
       Delivery Note   Brandi MartChelsey R Hundal is a 32 y.o. [id number] at 7961w5d Estimated Date of Delivery: 09/15/17  PRE-OPERATIVE DIAGNOSIS:  1) 4061w5d pregnancy.   POST-OPERATIVE DIAGNOSIS:  1) 7961w5d pregnancy s/p Vaginal, Spontaneous   Delivery Type: Vaginal, Spontaneous    Delivery Anesthesia: Epidural   Labor Complications:       ESTIMATED BLOOD LOSS: 100  ml    FINDINGS:   1) female infant, Apgar scores of 9    at 1 minute and 9    at 5 minutes and a birthweight of 128.75  ounces.    2) Nuchal cord: Yes  SPECIMENS:   PLACENTA:   Appearance:      Removal: Spontaneous      Disposition:     DISPOSITION:  Infant to left in stable condition in the delivery room, with L&D personnel and mother,  NARRATIVE SUMMARY: Labor course:  [name] is a [id number] at 2661w5d who presented for induction of labor.  She progressed well in labor without pitocin.  She received the appropriate anesthesia and proceeded to complete dilation. She evidenced good maternal expulsive effort during the second stage. She went on to deliver a viable infant. The placenta delivered without problems and was noted to be complete. A perineal and vaginal examination was performed. Episiotomy/Lacerations: None    Elonda Huskyavid J. Sevilla Murtagh, M.D. 09/20/2017 1:12 PM

## 2017-09-07 ENCOUNTER — Encounter: Payer: Self-pay | Admitting: Obstetrics and Gynecology

## 2017-09-07 ENCOUNTER — Telehealth: Payer: Self-pay

## 2017-09-07 ENCOUNTER — Ambulatory Visit (INDEPENDENT_AMBULATORY_CARE_PROVIDER_SITE_OTHER): Payer: BLUE CROSS/BLUE SHIELD | Admitting: Obstetrics and Gynecology

## 2017-09-07 VITALS — BP 127/79 | HR 79 | Wt 195.4 lb

## 2017-09-07 DIAGNOSIS — E669 Obesity, unspecified: Secondary | ICD-10-CM

## 2017-09-07 DIAGNOSIS — Z3483 Encounter for supervision of other normal pregnancy, third trimester: Secondary | ICD-10-CM

## 2017-09-07 LAB — POCT URINALYSIS DIPSTICK
Bilirubin, UA: NEGATIVE
Blood, UA: NEGATIVE
GLUCOSE UA: NEGATIVE
LEUKOCYTES UA: NEGATIVE
NITRITE UA: NEGATIVE
PROTEIN UA: NEGATIVE
SPEC GRAV UA: 1.015 (ref 1.010–1.025)
Urobilinogen, UA: 0.2 E.U./dL
pH, UA: 6 (ref 5.0–8.0)

## 2017-09-07 NOTE — Telephone Encounter (Signed)
error 

## 2017-09-07 NOTE — Progress Notes (Signed)
ROB: Patient without complaint.  S&S discussed.  Schedule induction at next visit in case of post dates.

## 2017-09-12 ENCOUNTER — Ambulatory Visit (INDEPENDENT_AMBULATORY_CARE_PROVIDER_SITE_OTHER): Payer: BLUE CROSS/BLUE SHIELD | Admitting: Obstetrics and Gynecology

## 2017-09-12 VITALS — BP 121/77 | HR 76 | Wt 201.9 lb

## 2017-09-12 DIAGNOSIS — O480 Post-term pregnancy: Secondary | ICD-10-CM

## 2017-09-12 DIAGNOSIS — Z3493 Encounter for supervision of normal pregnancy, unspecified, third trimester: Secondary | ICD-10-CM

## 2017-09-12 LAB — POCT URINALYSIS DIPSTICK
BILIRUBIN UA: NEGATIVE
Glucose, UA: NEGATIVE
Ketones: NEGATIVE
LEUKOCYTES UA: NEGATIVE
Nitrite, UA: NEGATIVE
PH UA: 6 (ref 5.0–8.0)
Protein, UA: NEGATIVE
RBC UA: NEGATIVE
Spec Grav, UA: 1.02 (ref 1.010–1.025)
UROBILINOGEN UA: 0.2 U/dL

## 2017-09-12 NOTE — Progress Notes (Signed)
ROB- Patient feels well with no complaints.  

## 2017-09-12 NOTE — Progress Notes (Signed)
ROB: Doing well, no complaints. RTC in 1 week. For BPP next week for post-dates. Discussed IOL, if no labor ensues, will schedule for IOL on 09/12/17.

## 2017-09-13 ENCOUNTER — Encounter: Payer: Self-pay | Admitting: Obstetrics and Gynecology

## 2017-09-19 ENCOUNTER — Ambulatory Visit (INDEPENDENT_AMBULATORY_CARE_PROVIDER_SITE_OTHER): Payer: BLUE CROSS/BLUE SHIELD

## 2017-09-19 ENCOUNTER — Encounter: Payer: Self-pay | Admitting: Obstetrics and Gynecology

## 2017-09-19 ENCOUNTER — Ambulatory Visit (INDEPENDENT_AMBULATORY_CARE_PROVIDER_SITE_OTHER): Payer: BLUE CROSS/BLUE SHIELD | Admitting: Obstetrics and Gynecology

## 2017-09-19 VITALS — BP 144/77 | HR 79 | Wt 198.6 lb

## 2017-09-19 DIAGNOSIS — O480 Post-term pregnancy: Secondary | ICD-10-CM

## 2017-09-19 DIAGNOSIS — Z3483 Encounter for supervision of other normal pregnancy, third trimester: Secondary | ICD-10-CM

## 2017-09-19 DIAGNOSIS — Z3493 Encounter for supervision of normal pregnancy, unspecified, third trimester: Secondary | ICD-10-CM | POA: Diagnosis not present

## 2017-09-19 LAB — POCT URINALYSIS DIPSTICK
BILIRUBIN UA: NEGATIVE
Blood, UA: NEGATIVE
GLUCOSE UA: NEGATIVE
Ketones, UA: NEGATIVE
Leukocytes, UA: NEGATIVE
Nitrite, UA: NEGATIVE
Spec Grav, UA: 1.015 (ref 1.010–1.025)
Urobilinogen, UA: 0.2 E.U./dL
pH, UA: 7 (ref 5.0–8.0)

## 2017-09-19 NOTE — Progress Notes (Signed)
ROB:  Pt with U/S today.  No complaints, no ctx.  Scheduled for induction tomorrow.  All questions answered.

## 2017-09-20 ENCOUNTER — Inpatient Hospital Stay
Admission: AD | Admit: 2017-09-20 | Discharge: 2017-09-22 | DRG: 807 | Disposition: A | Discharge status: Home / Self Care | Payer: BLUE CROSS/BLUE SHIELD | Source: Ambulatory Visit | Attending: Obstetrics and Gynecology | Admitting: Obstetrics and Gynecology

## 2017-09-20 ENCOUNTER — Inpatient Hospital Stay: Payer: BLUE CROSS/BLUE SHIELD | Admitting: Anesthesiology

## 2017-09-20 ENCOUNTER — Other Ambulatory Visit: Payer: Self-pay

## 2017-09-20 ENCOUNTER — Encounter: Payer: Self-pay | Admitting: *Deleted

## 2017-09-20 DIAGNOSIS — O480 Post-term pregnancy: Secondary | ICD-10-CM | POA: Diagnosis not present

## 2017-09-20 DIAGNOSIS — Z3A40 40 weeks gestation of pregnancy: Secondary | ICD-10-CM

## 2017-09-20 DIAGNOSIS — D649 Anemia, unspecified: Secondary | ICD-10-CM | POA: Diagnosis present

## 2017-09-20 DIAGNOSIS — E669 Obesity, unspecified: Secondary | ICD-10-CM | POA: Diagnosis present

## 2017-09-20 DIAGNOSIS — O9902 Anemia complicating childbirth: Secondary | ICD-10-CM | POA: Diagnosis present

## 2017-09-20 DIAGNOSIS — O99214 Obesity complicating childbirth: Secondary | ICD-10-CM | POA: Diagnosis present

## 2017-09-20 LAB — CBC
HCT: 30.7 % — ABNORMAL LOW (ref 35.0–47.0)
Hemoglobin: 9.9 g/dL — ABNORMAL LOW (ref 12.0–16.0)
MCH: 25.6 pg — AB (ref 26.0–34.0)
MCHC: 32.2 g/dL (ref 32.0–36.0)
MCV: 79.6 fL — ABNORMAL LOW (ref 80.0–100.0)
PLATELETS: 249 10*3/uL (ref 150–440)
RBC: 3.86 MIL/uL (ref 3.80–5.20)
RDW: 15.5 % — AB (ref 11.5–14.5)
WBC: 8.2 10*3/uL (ref 3.6–11.0)

## 2017-09-20 LAB — TYPE AND SCREEN
ABO/RH(D): O POS
Antibody Screen: NEGATIVE

## 2017-09-20 MED ORDER — ACETAMINOPHEN 325 MG PO TABS: 650.0000 mg | ORAL_TABLET | ORAL | Status: DC | PRN

## 2017-09-20 MED ORDER — PRENATAL MULTIVITAMIN CH
1.0000 | ORAL_TABLET | Freq: Every day | ORAL | Status: DC
  Administered 2017-09-21 – 2017-09-22 (×2): 1 via ORAL
  Filled 2017-09-20 (×2): qty 1

## 2017-09-20 MED ORDER — OXYTOCIN 40 UNITS IN LACTATED RINGERS INFUSION - SIMPLE MED
2.5000 [IU]/h | INTRAVENOUS | Status: DC | PRN
  Filled 2017-09-20: qty 1000

## 2017-09-20 MED ORDER — TETANUS-DIPHTH-ACELL PERTUSSIS 5-2.5-18.5 LF-MCG/0.5 IM SUSP: 0.5000 mL | Freq: Once | INTRAMUSCULAR | Status: DC

## 2017-09-20 MED ORDER — LIDOCAINE HCL (PF) 2 % IJ SOLN
INTRAMUSCULAR | Status: DC | PRN
  Administered 2017-09-20: 9 mL via INTRADERMAL

## 2017-09-20 MED ORDER — MISOPROSTOL 200 MCG PO TABS
ORAL_TABLET | ORAL | Status: AC
  Filled 2017-09-20: qty 4

## 2017-09-20 MED ORDER — FENTANYL 2.5 MCG/ML W/ROPIVACAINE 0.15% IN NS 100 ML EPIDURAL (ARMC)
EPIDURAL | Status: AC
  Filled 2017-09-20: qty 100

## 2017-09-20 MED ORDER — PHENYLEPHRINE 40 MCG/ML (10ML) SYRINGE FOR IV PUSH (FOR BLOOD PRESSURE SUPPORT)
80.0000 ug | PREFILLED_SYRINGE | INTRAVENOUS | Status: DC | PRN
  Filled 2017-09-20: qty 5

## 2017-09-20 MED ORDER — OXYTOCIN 10 UNIT/ML IJ SOLN
INTRAMUSCULAR | Status: AC
  Filled 2017-09-20: qty 2

## 2017-09-20 MED ORDER — FENTANYL 2.5 MCG/ML W/ROPIVACAINE 0.15% IN NS 100 ML EPIDURAL (ARMC)
12.0000 mL/h | EPIDURAL | Status: DC
  Administered 2017-09-20: 12 mL/h via EPIDURAL

## 2017-09-20 MED ORDER — DIPHENHYDRAMINE HCL 50 MG/ML IJ SOLN: 12.5000 mg | INTRAMUSCULAR | Status: DC | PRN

## 2017-09-20 MED ORDER — TERBUTALINE SULFATE 1 MG/ML IJ SOLN: 0.2500 mg | Freq: Once | INTRAMUSCULAR | Status: DC | PRN

## 2017-09-20 MED ORDER — IBUPROFEN 600 MG PO TABS
600.0000 mg | ORAL_TABLET | Freq: Four times a day (QID) | ORAL | Status: DC
  Administered 2017-09-20 (×2): 600 mg via ORAL
  Filled 2017-09-20 (×2): qty 1

## 2017-09-20 MED ORDER — OXYCODONE-ACETAMINOPHEN 5-325 MG PO TABS: 2.0000 | ORAL_TABLET | ORAL | Status: DC | PRN

## 2017-09-20 MED ORDER — LACTATED RINGERS IV SOLN: 500.0000 mL | Freq: Once | INTRAVENOUS | Status: DC

## 2017-09-20 MED ORDER — OXYTOCIN 40 UNITS IN LACTATED RINGERS INFUSION - SIMPLE MED
2.5000 [IU]/h | INTRAVENOUS | Status: DC
  Filled 2017-09-20: qty 1000

## 2017-09-20 MED ORDER — OXYTOCIN BOLUS FROM INFUSION
500.0000 mL | Freq: Once | INTRAVENOUS | Status: AC
  Administered 2017-09-20: 500 mL via INTRAVENOUS

## 2017-09-20 MED ORDER — SOD CITRATE-CITRIC ACID 500-334 MG/5ML PO SOLN: 30.0000 mL | ORAL | Status: DC | PRN

## 2017-09-20 MED ORDER — LACTATED RINGERS IV SOLN: 500.0000 mL | INTRAVENOUS | Status: DC | PRN

## 2017-09-20 MED ORDER — EPHEDRINE 5 MG/ML INJ
10.0000 mg | INTRAVENOUS | Status: DC | PRN
  Filled 2017-09-20: qty 2

## 2017-09-20 MED ORDER — BENZOCAINE-MENTHOL 20-0.5 % EX AERO: 1.0000 "application " | INHALATION_SPRAY | CUTANEOUS | Status: DC | PRN

## 2017-09-20 MED ORDER — DOCUSATE SODIUM 100 MG PO CAPS
100.0000 mg | ORAL_CAPSULE | Freq: Two times a day (BID) | ORAL | Status: DC
  Administered 2017-09-20 – 2017-09-22 (×4): 100 mg via ORAL
  Filled 2017-09-20 (×4): qty 1

## 2017-09-20 MED ORDER — BUPIVACAINE HCL (PF) 0.25 % IJ SOLN
INTRAMUSCULAR | Status: DC | PRN
  Administered 2017-09-20: 10 mL via EPIDURAL

## 2017-09-20 MED ORDER — OXYCODONE-ACETAMINOPHEN 5-325 MG PO TABS
1.0000 | ORAL_TABLET | ORAL | Status: DC | PRN
  Filled 2017-09-20: qty 1

## 2017-09-20 MED ORDER — IBUPROFEN 600 MG PO TABS
600.0000 mg | ORAL_TABLET | Freq: Four times a day (QID) | ORAL | Status: DC
  Administered 2017-09-21 – 2017-09-22 (×6): 600 mg via ORAL
  Filled 2017-09-20 (×5): qty 1

## 2017-09-20 MED ORDER — SIMETHICONE 80 MG PO CHEW: 80.0000 mg | CHEWABLE_TABLET | ORAL | Status: DC | PRN

## 2017-09-20 MED ORDER — MISOPROSTOL 25 MCG QUARTER TABLET
50.0000 ug | ORAL_TABLET | ORAL | Status: DC | PRN
  Administered 2017-09-20: 50 ug via VAGINAL
  Filled 2017-09-20: qty 1

## 2017-09-20 MED ORDER — AMMONIA AROMATIC IN INHA
RESPIRATORY_TRACT | Status: AC
  Filled 2017-09-20: qty 10

## 2017-09-20 MED ORDER — LACTATED RINGERS IV SOLN
INTRAVENOUS | Status: DC
  Administered 2017-09-20: 08:00:00 via INTRAVENOUS

## 2017-09-20 MED ORDER — OXYCODONE-ACETAMINOPHEN 5-325 MG PO TABS
1.0000 | ORAL_TABLET | ORAL | Status: DC | PRN
  Administered 2017-09-20: 1 via ORAL

## 2017-09-20 MED ORDER — ONDANSETRON HCL 4 MG/2ML IJ SOLN: 4.0000 mg | Freq: Four times a day (QID) | INTRAMUSCULAR | Status: DC | PRN

## 2017-09-20 MED ORDER — LIDOCAINE HCL (PF) 1 % IJ SOLN
INTRAMUSCULAR | Status: DC | PRN
  Administered 2017-09-20: 3 mL

## 2017-09-20 MED ORDER — ZOLPIDEM TARTRATE 5 MG PO TABS: 5.0000 mg | ORAL_TABLET | Freq: Every evening | ORAL | Status: DC | PRN

## 2017-09-20 MED ORDER — LIDOCAINE HCL (PF) 1 % IJ SOLN: 30.0000 mL | INTRAMUSCULAR | Status: DC | PRN

## 2017-09-20 MED ORDER — LIDOCAINE HCL (PF) 1 % IJ SOLN
INTRAMUSCULAR | Status: AC
  Filled 2017-09-20: qty 30

## 2017-09-20 MED ORDER — BUTORPHANOL TARTRATE 1 MG/ML IJ SOLN: 1.0000 mg | INTRAMUSCULAR | Status: DC | PRN

## 2017-09-20 MED ORDER — DIPHENHYDRAMINE HCL 25 MG PO CAPS: 25.0000 mg | ORAL_CAPSULE | Freq: Four times a day (QID) | ORAL | Status: DC | PRN

## 2017-09-20 MED ORDER — LIDOCAINE-EPINEPHRINE (PF) 1.5 %-1:200000 IJ SOLN
INTRAMUSCULAR | Status: DC | PRN
  Administered 2017-09-20: 3 mL via PERINEURAL

## 2017-09-20 NOTE — Anesthesia Preprocedure Evaluation (Signed)
Anesthesia Evaluation  Patient identified by MRN, date of birth, ID band Patient awake    Reviewed: Allergy & Precautions, NPO status , Patient's Chart, lab work & pertinent test results  Airway Mallampati: II  TM Distance: >3 FB     Dental no notable dental hx.    Pulmonary neg pulmonary ROS, former smoker,    Pulmonary exam normal        Cardiovascular negative cardio ROS Normal cardiovascular exam     Neuro/Psych negative neurological ROS  negative psych ROS   GI/Hepatic negative GI ROS, Neg liver ROS,   Endo/Other  negative endocrine ROS  Renal/GU negative Renal ROS  negative genitourinary   Musculoskeletal   Abdominal Normal abdominal exam  (+)   Peds negative pediatric ROS (+)  Hematology negative hematology ROS (+) anemia ,   Anesthesia Other Findings   Reproductive/Obstetrics (+) Pregnancy                             Anesthesia Physical Anesthesia Plan  ASA: II  Anesthesia Plan: Epidural   Post-op Pain Management:    Induction:   PONV Risk Score and Plan:   Airway Management Planned: Natural Airway  Additional Equipment:   Intra-op Plan:   Post-operative Plan:   Informed Consent: I have reviewed the patients History and Physical, chart, labs and discussed the procedure including the risks, benefits and alternatives for the proposed anesthesia with the patient or authorized representative who has indicated his/her understanding and acceptance.   Dental advisory given  Plan Discussed with: CRNA and Surgeon  Anesthesia Plan Comments:         Anesthesia Quick Evaluation
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
## 2017-09-20 NOTE — Anesthesia Procedure Notes (Signed)
Epidural Patient location during procedure: OB Start time: 09/20/2017 10:07 AM End time: 09/20/2017 10:19 AM  Staffing Anesthesiologist: Yves Dillarroll, Daun Rens, MD Performed: anesthesiologist   Preanesthetic Checklist Completed: patient identified, site marked, surgical consent, pre-op evaluation, timeout performed, IV checked, risks and benefits discussed and monitors and equipment checked  Epidural Patient position: sitting Prep: Betadine Patient monitoring: heart rate, continuous pulse ox and blood pressure Approach: midline Location: L3-L4 Injection technique: LOR air  Needle:  Needle type: Tuohy  Needle gauge: 17 G Needle length: 9 cm and 9 Catheter type: closed end flexible Catheter size: 19 Gauge Test dose: negative and 1.5% lidocaine with Epi 1:200 K  Assessment Events: blood not aspirated, injection not painful, no injection resistance, negative IV test and no paresthesia  Additional Notes Time out called.  Patient placed in sitting position. Prepped and draped in sterile fashion.  A skin wheal was made in the L3-L4 interspace with 1% Lidocaine plain.  The Tuohy needle was advanced into the epidural space by a loss of resistance technique.  The epidural catheter was threaded 3 cm and the TD was negative.  The patient toleratyed the procedure well.Reason for block:procedure for pain

## 2017-09-21 LAB — RPR: RPR Ser Ql: NONREACTIVE

## 2017-09-21 MED ORDER — COCONUT OIL OIL
1.0000 "application " | TOPICAL_OIL | Status: DC | PRN
  Administered 2017-09-21: 1 via TOPICAL
  Filled 2017-09-21: qty 120

## 2017-09-21 NOTE — Anesthesia Postprocedure Evaluation (Signed)
Anesthesia Post Note  Patient: Brandi West  Procedure(s) Performed: AN AD HOC LABOR EPIDURAL  Patient location during evaluation: Mother Baby Anesthesia Type: Epidural Level of consciousness: awake and alert and oriented Pain management: satisfactory to patient Vital Signs Assessment: post-procedure vital signs reviewed and stable Respiratory status: respiratory function stable Cardiovascular status: stable Postop Assessment: no backache, no headache, epidural receding, patient able to bend at knees, no apparent nausea or vomiting and adequate PO intake Anesthetic complications: no     Last Vitals:  Vitals:   09/20/17 2310 09/21/17 0300  BP: (!) 146/77 (!) 130/59  Pulse: (!) 58 60  Resp: 18 18  Temp: 36.6 C 36.7 C  SpO2: 100% 100%    Last Pain:  Vitals:   09/21/17 0300  TempSrc: Oral  PainSc:                  Clydene PughBeane, Siedah Sedor D

## 2017-09-22 NOTE — Discharge Summary (Signed)
                               Discharge Summary  Date of Admission: 09/20/2017  Date of Discharge: 09/22/2017  Admitting Diagnosis: Induction of labor at 2466w5d  Mode of Delivery: normal spontaneous vaginal delivery                 Discharge Diagnosis: No other diagnosis   Intrapartum Procedures: Atificial rupture of membranes and epidural   Post partum procedures:   Complications: none                      Discharge Day SOAP Note:  Progress Note - Vaginal Delivery  Brandi West is a 32 y.o. [id number] now PP day 2 s/p Vaginal, Spontaneous . Delivery was uncomplicated  Subjective  The patient has the following complaints: has no unusual complaints  Pain is controlled with current medications.   Patient is urinating without difficulty.  She is ambulating well.    Objective  Vital signs: BP (!) 149/75 (BP Location: Right Arm) Comment: Chief Executive OfficerJennifer RN notified   Pulse (!) 54   Temp 97.9 F (36.6 C) (Oral)   Resp 19   Ht 5\' 4"  (1.626 m)   Wt 198 lb (89.8 kg)   LMP 12/17/2016 (Exact Date)   SpO2 97%   Breastfeeding? Unknown   BMI 33.99 kg/m   Physical Exam: Gen: NAD Fundus Fundal Tone: Firm  Lochia Amount: Small  Perineum Appearance: Intact     Data Review Labs: CBC Latest Ref Rng & Units 09/20/2017 06/26/2017 02/16/2017  WBC 3.6 - 11.0 K/uL 8.2 7.2 6.5  Hemoglobin 12.0 - 16.0 g/dL 4.5(W9.9(L) 10.0(L) 11.2  Hematocrit 35.0 - 47.0 % 30.7(L) 31.7(L) 34.4  Platelets 150 - 440 K/uL 249 227 244   O POS  Assessment/Plan  Active Problems:   Post-dates pregnancy    Plan for discharge today.   Discharge Instructions: Per After Visit Summary. Activity: Advance as tolerated. Pelvic rest for 6 weeks.  Also refer to After Visit Summary Diet: Regular Medications: Allergies as of 09/22/2017   No Known Allergies     Medication List    TAKE these medications   PRENATAL VITAMINS PLUS PO Take by mouth.      Outpatient follow up:  Follow-up Information    Linzie CollinEvans,  Pieter Fooks James, MD Follow up in 6 week(s).   Specialty:  Obstetrics and Gynecology Contact information: [address] [place] [address] [phone]          Postpartum contraception: Will discuss at first office visit post-partum  Discharged Condition: good  Discharged to: home  Newborn Data: Disposition:home with mother  Apgars: APGAR (1 MIN): 9   APGAR (5 MINS): 9   APGAR (10 MINS):    Baby Feeding: Breast    Elonda Huskyavid J. Daxx Tiggs, M.D. 09/22/2017 10:28 AM

## 2017-09-22 NOTE — Progress Notes (Signed)
Reviewed discharge teaching Mother verbalized understanding. Infant bands match. Infant discharged home with parents in carseat.

## 2017-11-01 ENCOUNTER — Encounter: Payer: BLUE CROSS/BLUE SHIELD | Admitting: Obstetrics and Gynecology

## 2017-11-07 ENCOUNTER — Encounter: Payer: Self-pay | Admitting: Obstetrics and Gynecology

## 2017-11-07 ENCOUNTER — Ambulatory Visit (INDEPENDENT_AMBULATORY_CARE_PROVIDER_SITE_OTHER): Payer: BLUE CROSS/BLUE SHIELD | Admitting: Obstetrics and Gynecology
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
## 2019-02-25 ENCOUNTER — Ambulatory Visit (INDEPENDENT_AMBULATORY_CARE_PROVIDER_SITE_OTHER)
Admission: RE | Admit: 2019-02-25 | Discharge: 2019-02-25 | Disposition: A | Discharge status: Home / Self Care | Payer: BC Managed Care – PPO | Source: Ambulatory Visit

## 2019-02-25 DIAGNOSIS — J069 Acute upper respiratory infection, unspecified: Secondary | ICD-10-CM

## 2019-02-25 MED ORDER — FLUTICASONE PROPIONATE 50 MCG/ACT NA SUSP
1.0000 | Freq: Every day | NASAL | 0 refills | Status: DC
Start: 2019-02-25 — End: 2021-02-18

## 2019-02-25 MED ORDER — CETIRIZINE-PSEUDOEPHEDRINE ER 5-120 MG PO TB12: 1.0000 | ORAL_TABLET | Freq: Two times a day (BID) | ORAL | 0 refills | Status: DC
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
## 2019-02-25 NOTE — Discharge Instructions (Addendum)
Please use flonase nasal spray 1-2 sprays each nostril daily Zyrtec D twice daily Follow up if symptoms persisting or worsening

## 2021-02-18 ENCOUNTER — Encounter: Payer: Self-pay | Admitting: Emergency Medicine

## 2021-02-18 ENCOUNTER — Ambulatory Visit
Admission: EM | Admit: 2021-02-18 | Discharge: 2021-02-18 | Disposition: A | Discharge status: Home / Self Care | Payer: BC Managed Care – PPO | Attending: Emergency Medicine | Admitting: Emergency Medicine

## 2021-02-18 ENCOUNTER — Other Ambulatory Visit: Payer: Self-pay

## 2021-02-18 DIAGNOSIS — J029 Acute pharyngitis, unspecified: Secondary | ICD-10-CM | POA: Insufficient documentation

## 2021-02-18 LAB — GROUP A STREP BY PCR: Group A Strep by PCR: NOT DETECTED
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
## 2021-02-18 NOTE — Discharge Instructions (Addendum)
Testing today did not reveal the presence of strep.  Use over-the-counter Tylenol and ibuprofen according to the package instructions as needed for pain.  Gargle with warm salt water 2-3 times a day decrease swelling and tissues, soothe your throat, and aid in pain relief.  Return for reevaluation, or see your primary care provider, for new or worsening symptoms.

## 2021-02-18 NOTE — ED Triage Notes (Signed)
Patient c/o sore throat that has been going on for over a week.  Patient denies fevers.
